# Patient Record
Sex: Male | Born: 1950 | Race: White | Hispanic: No | Marital: Married | Smoking: Never smoker
Health system: Southern US, Community
[De-identification: ages and names within clinical notes are randomized; demographics above are authoritative.]

## PROBLEM LIST (undated history)

## (undated) DIAGNOSIS — N200 Calculus of kidney: Secondary | ICD-10-CM

## (undated) HISTORY — PX: EYE SURGERY: SHX253

## (undated) HISTORY — DX: Calculus of kidney: N20.0

---

## 2006-12-15 ENCOUNTER — Ambulatory Visit: Payer: Self-pay | Admitting: Internal Medicine

## 2008-09-06 ENCOUNTER — Emergency Department: Payer: Self-pay | Admitting: Emergency Medicine

## 2012-01-14 DIAGNOSIS — M1711 Unilateral primary osteoarthritis, right knee: Secondary | ICD-10-CM | POA: Insufficient documentation

## 2016-11-01 DIAGNOSIS — M1711 Unilateral primary osteoarthritis, right knee: Secondary | ICD-10-CM | POA: Insufficient documentation

## 2016-11-01 DIAGNOSIS — E669 Obesity, unspecified: Secondary | ICD-10-CM | POA: Insufficient documentation

## 2018-12-31 ENCOUNTER — Emergency Department: Payer: Medicare Other

## 2018-12-31 ENCOUNTER — Other Ambulatory Visit: Payer: Self-pay

## 2018-12-31 ENCOUNTER — Observation Stay
Admission: EM | Admit: 2018-12-31 | Discharge: 2019-01-01 | Disposition: A | Discharge status: Home / Self Care | Payer: Medicare Other | Attending: Internal Medicine | Admitting: Internal Medicine

## 2018-12-31 ENCOUNTER — Encounter: Payer: Self-pay | Admitting: Emergency Medicine

## 2018-12-31 ENCOUNTER — Other Ambulatory Visit: Payer: Self-pay | Admitting: Urology

## 2018-12-31 DIAGNOSIS — N179 Acute kidney failure, unspecified: Secondary | ICD-10-CM | POA: Diagnosis present

## 2018-12-31 DIAGNOSIS — E785 Hyperlipidemia, unspecified: Secondary | ICD-10-CM | POA: Diagnosis not present

## 2018-12-31 DIAGNOSIS — N200 Calculus of kidney: Secondary | ICD-10-CM

## 2018-12-31 DIAGNOSIS — E7800 Pure hypercholesterolemia, unspecified: Secondary | ICD-10-CM | POA: Diagnosis not present

## 2018-12-31 DIAGNOSIS — R008 Other abnormalities of heart beat: Secondary | ICD-10-CM | POA: Insufficient documentation

## 2018-12-31 DIAGNOSIS — I700 Atherosclerosis of aorta: Secondary | ICD-10-CM | POA: Diagnosis not present

## 2018-12-31 DIAGNOSIS — N132 Hydronephrosis with renal and ureteral calculous obstruction: Secondary | ICD-10-CM | POA: Diagnosis not present

## 2018-12-31 DIAGNOSIS — K5730 Diverticulosis of large intestine without perforation or abscess without bleeding: Secondary | ICD-10-CM | POA: Diagnosis not present

## 2018-12-31 DIAGNOSIS — N23 Unspecified renal colic: Secondary | ICD-10-CM | POA: Diagnosis present

## 2018-12-31 DIAGNOSIS — Z6833 Body mass index (BMI) 33.0-33.9, adult: Secondary | ICD-10-CM | POA: Insufficient documentation

## 2018-12-31 DIAGNOSIS — Z86010 Personal history of colonic polyps: Secondary | ICD-10-CM | POA: Insufficient documentation

## 2018-12-31 DIAGNOSIS — R9431 Abnormal electrocardiogram [ECG] [EKG]: Secondary | ICD-10-CM | POA: Diagnosis not present

## 2018-12-31 DIAGNOSIS — K219 Gastro-esophageal reflux disease without esophagitis: Secondary | ICD-10-CM | POA: Diagnosis not present

## 2018-12-31 DIAGNOSIS — Z8249 Family history of ischemic heart disease and other diseases of the circulatory system: Secondary | ICD-10-CM | POA: Diagnosis not present

## 2018-12-31 DIAGNOSIS — M1990 Unspecified osteoarthritis, unspecified site: Secondary | ICD-10-CM | POA: Insufficient documentation

## 2018-12-31 DIAGNOSIS — Z79899 Other long term (current) drug therapy: Secondary | ICD-10-CM | POA: Insufficient documentation

## 2018-12-31 DIAGNOSIS — N400 Enlarged prostate without lower urinary tract symptoms: Secondary | ICD-10-CM | POA: Diagnosis not present

## 2018-12-31 DIAGNOSIS — N139 Obstructive and reflux uropathy, unspecified: Secondary | ICD-10-CM | POA: Diagnosis not present

## 2018-12-31 DIAGNOSIS — E876 Hypokalemia: Secondary | ICD-10-CM | POA: Diagnosis not present

## 2018-12-31 DIAGNOSIS — Z20828 Contact with and (suspected) exposure to other viral communicable diseases: Secondary | ICD-10-CM | POA: Insufficient documentation

## 2018-12-31 DIAGNOSIS — E669 Obesity, unspecified: Secondary | ICD-10-CM | POA: Diagnosis not present

## 2018-12-31 LAB — COMPREHENSIVE METABOLIC PANEL
ALT: 25 U/L (ref 0–44)
AST: 27 U/L (ref 15–41)
Albumin: 4.3 g/dL (ref 3.5–5.0)
Alkaline Phosphatase: 74 U/L (ref 38–126)
Anion gap: 16 — ABNORMAL HIGH (ref 5–15)
BUN: 20 mg/dL (ref 8–23)
CO2: 23 mmol/L (ref 22–32)
Calcium: 9.4 mg/dL (ref 8.9–10.3)
Chloride: 105 mmol/L (ref 98–111)
Creatinine, Ser: 1.5 mg/dL — ABNORMAL HIGH (ref 0.61–1.24)
GFR calc Af Amer: 55 mL/min — ABNORMAL LOW (ref >60)
GFR calc non Af Amer: 48 mL/min — ABNORMAL LOW (ref >60)
Glucose, Bld: 185 mg/dL — ABNORMAL HIGH (ref 70–99)
Potassium: 3.4 mmol/L — ABNORMAL LOW (ref 3.5–5.1)
Sodium: 144 mmol/L (ref 135–145)
Total Bilirubin: 0.7 mg/dL (ref 0.3–1.2)
Total Protein: 7.8 g/dL (ref 6.5–8.1)

## 2018-12-31 LAB — CBC WITH DIFFERENTIAL/PLATELET
Abs Immature Granulocytes: 0.07 10*3/uL (ref 0.00–0.07)
Basophils Absolute: 0.1 10*3/uL (ref 0.0–0.1)
Basophils Relative: 0 %
Eosinophils Absolute: 0 10*3/uL (ref 0.0–0.5)
Eosinophils Relative: 0 %
HCT: 43.1 % (ref 39.0–52.0)
Hemoglobin: 14.3 g/dL (ref 13.0–17.0)
Immature Granulocytes: 1 %
Lymphocytes Relative: 20 %
Lymphs Abs: 2.3 10*3/uL (ref 0.7–4.0)
MCH: 29.9 pg (ref 26.0–34.0)
MCHC: 33.2 g/dL (ref 30.0–36.0)
MCV: 90.2 fL (ref 80.0–100.0)
Monocytes Absolute: 0.9 10*3/uL (ref 0.1–1.0)
Monocytes Relative: 7 %
Neutro Abs: 8.3 10*3/uL — ABNORMAL HIGH (ref 1.7–7.7)
Neutrophils Relative %: 72 %
Platelets: 222 10*3/uL (ref 150–400)
RBC: 4.78 MIL/uL (ref 4.22–5.81)
RDW: 12.7 % (ref 11.5–15.5)
WBC: 11.7 10*3/uL — ABNORMAL HIGH (ref 4.0–10.5)
nRBC: 0 % (ref 0.0–0.2)

## 2018-12-31 LAB — URINALYSIS, COMPLETE (UACMP) WITH MICROSCOPIC
Bacteria, UA: NONE SEEN
Bilirubin Urine: NEGATIVE
Glucose, UA: 50 mg/dL — AB
Hgb urine dipstick: NEGATIVE
Ketones, ur: 20 mg/dL — AB
Leukocytes,Ua: NEGATIVE
Nitrite: NEGATIVE
Protein, ur: NEGATIVE mg/dL
Specific Gravity, Urine: 1.017 (ref 1.005–1.030)
pH: 7 (ref 5.0–8.0)

## 2018-12-31 LAB — RESPIRATORY PANEL BY RT PCR (FLU A&B, COVID)
Influenza A by PCR: NEGATIVE
Influenza B by PCR: NEGATIVE
SARS Coronavirus 2 by RT PCR: NEGATIVE

## 2018-12-31 LAB — LIPASE, BLOOD: Lipase: 28 U/L (ref 11–51)

## 2018-12-31 LAB — HIV ANTIBODY (ROUTINE TESTING W REFLEX): HIV Screen 4th Generation wRfx: NONREACTIVE

## 2018-12-31 MED ORDER — ONDANSETRON HCL 4 MG/2ML IJ SOLN
INTRAMUSCULAR | Status: AC
  Administered 2018-12-31: 4 mg via INTRAVENOUS
  Filled 2018-12-31: qty 2

## 2018-12-31 MED ORDER — SODIUM CHLORIDE 0.9 % IV BOLUS
1000.0000 mL | Freq: Once | INTRAVENOUS | Status: AC
  Administered 2018-12-31: 1000 mL via INTRAVENOUS

## 2018-12-31 MED ORDER — ONDANSETRON HCL 4 MG/2ML IJ SOLN
INTRAMUSCULAR | Status: AC
  Filled 2018-12-31: qty 2

## 2018-12-31 MED ORDER — ONDANSETRON HCL 4 MG/2ML IJ SOLN
4.0000 mg | Freq: Four times a day (QID) | INTRAMUSCULAR | Status: DC | PRN
  Administered 2018-12-31 – 2019-01-01 (×3): 4 mg via INTRAVENOUS
  Filled 2018-12-31 (×3): qty 2

## 2018-12-31 MED ORDER — KETOROLAC TROMETHAMINE 30 MG/ML IJ SOLN: 15.0000 mg | Freq: Once | INTRAMUSCULAR | Status: AC

## 2018-12-31 MED ORDER — ATORVASTATIN CALCIUM 10 MG PO TABS
10.0000 mg | ORAL_TABLET | Freq: Every day | ORAL | Status: DC
  Administered 2018-12-31: 10 mg via ORAL
  Filled 2018-12-31 (×2): qty 1

## 2018-12-31 MED ORDER — ACETAMINOPHEN 500 MG PO TABS
1000.0000 mg | ORAL_TABLET | Freq: Once | ORAL | Status: AC
  Administered 2018-12-31: 1000 mg via ORAL
  Filled 2018-12-31: qty 2

## 2018-12-31 MED ORDER — ONDANSETRON HCL 4 MG/2ML IJ SOLN
4.0000 mg | Freq: Once | INTRAMUSCULAR | Status: AC
  Administered 2018-12-31: 4 mg via INTRAVENOUS

## 2018-12-31 MED ORDER — KETOROLAC TROMETHAMINE 30 MG/ML IJ SOLN
INTRAMUSCULAR | Status: AC
  Filled 2018-12-31: qty 1

## 2018-12-31 MED ORDER — KETOROLAC TROMETHAMINE 15 MG/ML IJ SOLN
15.0000 mg | Freq: Four times a day (QID) | INTRAMUSCULAR | Status: DC | PRN
  Administered 2018-12-31 – 2019-01-01 (×3): 15 mg via INTRAVENOUS
  Filled 2018-12-31 (×4): qty 1

## 2018-12-31 MED ORDER — MORPHINE SULFATE (PF) 4 MG/ML IV SOLN
4.0000 mg | INTRAVENOUS | Status: DC | PRN
  Administered 2018-12-31 (×2): 4 mg via INTRAVENOUS
  Filled 2018-12-31 (×3): qty 1

## 2018-12-31 MED ORDER — FENTANYL CITRATE (PF) 100 MCG/2ML IJ SOLN
50.0000 ug | Freq: Once | INTRAMUSCULAR | Status: AC
  Administered 2018-12-31: 50 ug via INTRAVENOUS
  Filled 2018-12-31: qty 2

## 2018-12-31 MED ORDER — OXYCODONE HCL 5 MG PO TABS
5.0000 mg | ORAL_TABLET | Freq: Once | ORAL | Status: AC
  Administered 2018-12-31: 5 mg via ORAL
  Filled 2018-12-31: qty 1

## 2018-12-31 MED ORDER — ONDANSETRON HCL 4 MG/2ML IJ SOLN: 4.0000 mg | Freq: Once | INTRAMUSCULAR | Status: AC

## 2018-12-31 MED ORDER — ONDANSETRON HCL 4 MG PO TABS
4.0000 mg | ORAL_TABLET | Freq: Four times a day (QID) | ORAL | Status: DC | PRN
  Filled 2018-12-31: qty 1

## 2018-12-31 MED ORDER — TAMSULOSIN HCL 0.4 MG PO CAPS
0.4000 mg | ORAL_CAPSULE | Freq: Once | ORAL | Status: AC
  Administered 2018-12-31: 0.4 mg via ORAL
  Filled 2018-12-31: qty 1

## 2018-12-31 MED ORDER — CEFAZOLIN SODIUM-DEXTROSE 1-4 GM/50ML-% IV SOLN
1.0000 g | INTRAVENOUS | Status: AC
  Administered 2019-01-01: 1 g via INTRAVENOUS
  Filled 2018-12-31: qty 50

## 2018-12-31 MED ORDER — MORPHINE SULFATE (PF) 4 MG/ML IV SOLN
INTRAVENOUS | Status: AC
  Filled 2018-12-31: qty 1

## 2018-12-31 MED ORDER — POTASSIUM CHLORIDE CRYS ER 20 MEQ PO TBCR: 40.0000 meq | EXTENDED_RELEASE_TABLET | Freq: Once | ORAL | Status: DC

## 2018-12-31 MED ORDER — MORPHINE SULFATE (PF) 4 MG/ML IV SOLN
4.0000 mg | Freq: Once | INTRAVENOUS | Status: AC
  Administered 2018-12-31: 4 mg via INTRAVENOUS

## 2018-12-31 MED ORDER — SODIUM CHLORIDE 0.9 % IV SOLN
INTRAVENOUS | Status: DC
  Administered 2018-12-31 (×2): via INTRAVENOUS

## 2018-12-31 MED ORDER — MORPHINE SULFATE (PF) 4 MG/ML IV SOLN
4.0000 mg | INTRAVENOUS | Status: DC | PRN
  Administered 2018-12-31: 4 mg via INTRAVENOUS

## 2018-12-31 MED ORDER — KETOROLAC TROMETHAMINE 30 MG/ML IJ SOLN
INTRAMUSCULAR | Status: AC
  Administered 2018-12-31: 30 mg via INTRAVENOUS
  Filled 2018-12-31: qty 1

## 2018-12-31 MED ORDER — METOCLOPRAMIDE HCL 5 MG/ML IJ SOLN
10.0000 mg | Freq: Once | INTRAMUSCULAR | Status: AC
  Administered 2018-12-31: 10 mg via INTRAVENOUS
  Filled 2018-12-31: qty 2

## 2018-12-31 NOTE — ED Notes (Signed)
Patient transported to CT 

## 2018-12-31 NOTE — Consult Note (Signed)
Urology Consult  Chief Complaint: Kidney stone  History of Present Illness: Trevor Armstrong is a 68 y.o. male seen in consultation at the request of Dr. Cruzita Lederer for a left proximal ureteral calculus.  Last night he had onset of nausea and vomiting and subsequently developed left lower quadrant abdominal pain.  He was seen in the ED where his pain became localized to the left flank.  No identifiable precipitating, aggravating or alleviating factors.  Pain was rated severe.  Denied fever, chills or bothersome lower urinary tract symptoms.    CT the abdomen pelvis was performed which was remarkable for a 7 mm left proximal ureteral calculus.  He also had a nonobstructing 5 mm right renal calculus.  He received multiple doses of IV analgesics including Toradol and is admitted to the hospitalist for pain control.  No prior history of urologic problems or stone disease.  SARS-CoV-2 was negative.  History reviewed. No pertinent past medical history.  History reviewed. No pertinent surgical history.  Home Medications:  Current Meds  Medication Sig  . atorvastatin (LIPITOR) 10 MG tablet Take 10 mg by mouth daily.  . NON FORMULARY Take 4.5 mg by mouth daily.  . valACYclovir (VALTREX) 1000 MG tablet Take 1,000 mg by mouth 2 (two) times daily.     Allergies:  Allergies  Allergen Reactions  . Sulfa Antibiotics     No family history on file.  Social History:  reports that he has never smoked. He has never used smokeless tobacco. No history on file for alcohol and drug.  ROS: A complete review of systems was performed.  All systems are negative except for pertinent findings as noted.  Physical Exam:  Vital signs in last 24 hours: Temp:  [97.7 F (36.5 C)] 97.7 F (36.5 C) (12/09 0312) Pulse Rate:  [49-92] 88 (12/09 0700) Resp:  [15-20] 15 (12/09 0700) BP: (133-155)/(63-91) 133/63 (12/09 0700) SpO2:  [91 %-97 %] 91 % (12/09 0700) Weight:  [104.3 kg] 104.3 kg (12/09 0314)  Constitutional:  Alert and oriented, No acute distress HEENT: [place] AT, moist mucus membranes.  Trachea midline, no masses Cardiovascular: Regular rate and rhythm, no clubbing, cyanosis, or edema. Respiratory: Normal respiratory effort, lungs clear bilaterally GI: Abdomen is soft, nontender, nondistended, no abdominal masses GU: No CVA tenderness Skin: No rashes, bruises or suspicious lesions Lymph: No cervical or inguinal adenopathy Neurologic: Grossly intact, no focal deficits, moving all 4 extremities Psychiatric: Normal mood and affect   Laboratory Data:  Recent Labs    12/31/18 0327  WBC 11.7*  HGB 14.3  HCT 43.1   Recent Labs    12/31/18 0327  NA 144  K 3.4*  CL 105  CO2 23  GLUCOSE 185*  BUN 20  CREATININE 1.50*  CALCIUM 9.4   No results for input(s): LABPT, INR in the last 72 hours. No results for input(s): LABURIN in the last 72 hours. Results for orders placed or performed during the hospital encounter of 12/31/18  Respiratory Panel by RT PCR (Flu A&B, Covid) - Nasopharyngeal Swab     Status: None   Collection Time: 12/31/18  6:07 AM   Specimen: Nasopharyngeal Swab  Result Value Ref Range Status   SARS Coronavirus 2 by RT PCR NEGATIVE NEGATIVE Final    Comment: (NOTE) SARS-CoV-2 target nucleic acids are NOT DETECTED. The SARS-CoV-2 RNA is generally detectable in upper respiratoy specimens during the acute phase of infection. The lowest concentration of SARS-CoV-2 viral copies this assay can detect is  131 copies/mL. A negative result does not preclude SARS-Cov-2 infection and should not be used as the sole basis for treatment or other patient management decisions. A negative result may occur with  improper specimen collection/handling, submission of specimen other than nasopharyngeal swab, presence of viral mutation(s) within the areas targeted by this assay, and inadequate number of viral copies (<131 copies/mL). A negative result must be combined with  clinical observations, patient history, and epidemiological information. The expected result is Negative. Fact Sheet for Patients:  https://www.moore.com/ Fact Sheet for Healthcare Providers:  https://www.young.biz/ This test is not yet ap proved or cleared by the [place] FDA and  has been authorized for detection and/or diagnosis of SARS-CoV-2 by FDA under an Emergency Use Authorization (EUA). This EUA will remain  in effect (meaning this test can be used) for the duration of the COVID-19 declaration under Section 564(b)(1) of the Act, 21 U.S.C. section 360bbb-3(b)(1), unless the authorization is terminated or revoked sooner.    Influenza A by PCR NEGATIVE NEGATIVE Final   Influenza B by PCR NEGATIVE NEGATIVE Final    Comment: (NOTE) The Xpert Xpress SARS-CoV-2/FLU/RSV assay is intended as an aid in  the diagnosis of influenza from Nasopharyngeal swab specimens and  should not be used as a sole basis for treatment. Nasal washings and  aspirates are unacceptable for Xpert Xpress SARS-CoV-2/FLU/RSV  testing. Fact Sheet for Patients: https://www.moore.com/ Fact Sheet for Healthcare Providers: https://www.young.biz/ This test is not yet approved or cleared by the [place] FDA and  has been authorized for detection and/or diagnosis of SARS-CoV-2 by  FDA under an Emergency Use Authorization (EUA). This EUA will remain  in effect (meaning this test can be used) for the duration of the  Covid-19 declaration under Section 564(b)(1) of the Act, 21  U.S.C. section 360bbb-3(b)(1), unless the authorization is  terminated or revoked. Performed at [facility], [address], [place], [place] [address]      Radiologic Imaging: Images were personally reviewed  Ct Renal Stone Study  Result Date: 12/31/2018 CLINICAL DATA:  Flank pain with stone disease suspected, left-sided. EXAM: CT ABDOMEN  AND PELVIS WITHOUT CONTRAST TECHNIQUE: Multidetector CT imaging of the abdomen and pelvis was performed following the standard protocol without IV contrast. COMPARISON:  None. FINDINGS: Lower chest:  No contributory findings. Hepatobiliary: No focal liver abnormality.No evidence of biliary obstruction or stone. Pancreas: Unremarkable. Spleen: Unremarkable. Adrenals/Urinary Tract: Negative adrenals. 5 mm stone at the lower pole right kidney. 7 x 6 mm proximal left ureteral stone with hydronephrosis, renal expansion, and perinephric edema. Cystic density from the lower pole right kidney. Unremarkable bladder. Stomach/Bowel: No obstruction. No appendicitis. Sigmoid diverticulosis. Vascular/Lymphatic: No acute vascular abnormality. Moderate atherosclerotic calcification of the aorta. No mass or adenopathy. Reproductive:Mild enlargement of the prostate up lifting the bladder base. Other: No ascites or pneumoperitoneum. Musculoskeletal: Spondylosis and facet arthropathy. Levoscoliosis of the thoracolumbar spine. IMPRESSION: 1. Obstructing 7 x 6 mm left proximal ureteral stone. 2. 5 mm right renal calculus. 3.  Aortic Atherosclerosis (ICD10-I70.0). 4. Sigmoid diverticulosis. Electronically Signed   By: Marnee Spring M.D.   On: 12/31/2018 04:06    Impression/Assessment:  68 y.o. male with left renal colic secondary to a 7 mm left proximal ureteral calculus.  He is presently pain-free.  His calculus is superior to the left lower pole and he is not a candidate for shockwave lithotripsy tomorrow since he received ketorolac.  He was informed based on stone size it is unlikely he will be  able to pass spontaneously.  Recommendation:  -Based on urgency, clinic schedule and OR availability will add on for left ureteroscopy/laser lithotripsy with stone removal tomorrow and lieu of urgent stenting today.  -Other options were reviewed including a trial of medical expulsion therapy and shockwave lithotripsy performed next  week. He prefers to have his stone treated as soon as possible.   12/31/2018, 8:04 AM  Irineo AxonScott Stoioff,  MD

## 2018-12-31 NOTE — ED Provider Notes (Signed)
[facility] Emergency Department Provider Note  ____________________________________________  Time seen: Approximately 3:35 AM  I have reviewed the triage vital signs and the nursing notes.   HISTORY  Chief Complaint Abdominal Pain   HPI Chrystopher Stangl is a 68 y.o. male with a history of arthritis, GERD, hyperlipidemia, diverticulitis who presents for evaluation of abdominal and back pain.  Pain started at midnight while patient was watching TV.  He describes the pain as dull with intermittent sharp component.  Initially on the left side of his abdomen, now it is on the left flank area.  He has had nausea and several episodes of nonbloody nonbilious emesis.  The pain is severe.  No fever or chills, no dysuria or hematuria, no constipation or diarrhea, no chest pain or shortness of breath, no paresthesias of his extremities.  Patient denies any history of kidney stones or any prior abdominal surgeries.   PMH Obesity (BMI 35.0-39.9 without comorbidity), unspecified 11/01/2016  Primary osteoarthritis of left knee 11/01/2016  Primary osteoarthritis of right knee 11/01/2016  Articular cartilage disorder, other specified site 01/15/2012  Knee pain 01/14/2012  Arthritis of knee, right 01/14/2012  GERD (gastroesophageal reflux disease)   Overview:   symptomatically improved on omeprazole   Elevated triglycerides with high cholesterol   Psoriasis   History of colonic polyps   Overview:   Family history of colon cancer in father (87's) and brother (33's).  Last screening 2012 showed tubular adenoma x 3.    Diverticulitis   Overview:   acute episode 2009   Anxiety and depression   Overview:   symptomatically improved on antidepressant medication.   Hyperlipidemia      Allergies Sulfa antibiotics  FH Thyroid disease Brother    Cancer Brother Bill prostate  Cancer Brother Mark colan  Diabetes Brother Loraine Leriche   Stroke Brother Loraine Leriche    Cancer Father Molly Maduro colan  Colon cancer Father Molly Maduro   Coronary Artery Disease (Blocked arteries around heart) Father Molly Maduro   Thyroid disease Father Molly Maduro   Diabetes Mother Britta Mccreedy   Diabetes type II Mother Britta Mccreedy   Stroke Mother Britta Mccreedy      Social History Social History   Tobacco Use  . Smoking status: Never Smoker  . Smokeless tobacco: Never Used  Substance Use Topics  . Alcohol use: Not on file  . Drug use: Not on file    Review of Systems  Constitutional: Negative for fever. Eyes: Negative for visual changes. ENT: Negative for sore throat. Neck: No neck pain  Cardiovascular: Negative for chest pain. Respiratory: Negative for shortness of breath. Gastrointestinal: + L sided abdominal pain, nausea, and vomiting. No diarrhea. Genitourinary: Negative for dysuria. Musculoskeletal: Negative for back pain. + L flank pain Skin: Negative for rash. Neurological: Negative for headaches, weakness or numbness. Psych: No SI or HI  ____________________________________________   PHYSICAL EXAM:  VITAL SIGNS: ED Triage Vitals  Enc Vitals Group     BP 12/31/18 0312 (!) 155/76     Pulse Rate 12/31/18 0312 63     Resp 12/31/18 0312 20     Temp 12/31/18 0312 97.7 F (36.5 C)     Temp Source 12/31/18 0312 Oral     SpO2 12/31/18 0312 96 %     Weight 12/31/18 0314 230 lb (104.3 kg)     Height 12/31/18 0314 5\' 9"  (1.753 m)     Head Circumference --      Peak Flow --      Pain Score 12/31/18  [id number] 10     Pain Loc --      Pain Edu? --      Excl. in GC? --     Constitutional: Alert and oriented. Well appearing and in no apparent distress. HEENT:      Head: Normocephalic and atraumatic.         Eyes: Conjunctivae are normal. Sclera is non-icteric.       Mouth/Throat: Mucous membranes are moist.       Neck: Supple with no signs of meningismus. Cardiovascular: Regular rate and rhythm. No murmurs, gallops, or rubs. 2+ symmetrical distal pulses are present in all  extremities. No JVD. Respiratory: Normal respiratory effort. Lungs are clear to auscultation bilaterally. No wheezes, crackles, or rhonchi.  Gastrointestinal: Soft, non tender, and non distended with positive bowel sounds. No rebound or guarding. Genitourinary: No CVA tenderness. Musculoskeletal: Nontender with normal range of motion in all extremities. No edema, cyanosis, or erythema of extremities. Neurologic: Normal speech and language. Face is symmetric. Moving all extremities. No gross focal neurologic deficits are appreciated. Skin: Skin is warm, dry and intact. No rash noted. Psychiatric: Mood and affect are normal. Speech and behavior are normal.  ____________________________________________   LABS (all labs ordered are listed, but only abnormal results are displayed)  Labs Reviewed  CBC WITH DIFFERENTIAL/PLATELET - Abnormal; Notable for the following components:      Result Value   WBC 11.7 (*)    Neutro Abs 8.3 (*)    All other components within normal limits  COMPREHENSIVE METABOLIC PANEL - Abnormal; Notable for the following components:   Potassium 3.4 (*)    Glucose, Bld 185 (*)    Creatinine, Ser 1.50 (*)    GFR calc non Af Amer 48 (*)    GFR calc Af Amer 55 (*)    Anion gap 16 (*)    All other components within normal limits  URINALYSIS, COMPLETE (UACMP) WITH MICROSCOPIC - Abnormal; Notable for the following components:   Color, Urine YELLOW (*)    APPearance HAZY (*)    Glucose, UA 50 (*)    Ketones, ur 20 (*)    All other components within normal limits  RESPIRATORY PANEL BY RT PCR (FLU A&B, COVID)  LIPASE, BLOOD   ____________________________________________  EKG  ED ECG REPORT I, Nita Sicklearolina Trevor Wilkie, the attending physician, personally viewed and interpreted this ECG.  Normal sinus rhythm, rate of 94, prolonged QTC, several PVCs, normal axis, no ST elevations or depressions.  No prior for comparison. ____________________________________________   RADIOLOGY  I have personally reviewed the images performed during this visit and I agree with the Radiologist's read.   Interpretation by Radiologist:  Ct Renal Stone Study  Result Date: 12/31/2018 CLINICAL DATA:  Flank pain with stone disease suspected, left-sided. EXAM: CT ABDOMEN AND PELVIS WITHOUT CONTRAST TECHNIQUE: Multidetector CT imaging of the abdomen and pelvis was performed following the standard protocol without IV contrast. COMPARISON:  None. FINDINGS: Lower chest:  No contributory findings. Hepatobiliary: No focal liver abnormality.No evidence of biliary obstruction or stone. Pancreas: Unremarkable. Spleen: Unremarkable. Adrenals/Urinary Tract: Negative adrenals. 5 mm stone at the lower pole right kidney. 7 x 6 mm proximal left ureteral stone with hydronephrosis, renal expansion, and perinephric edema. Cystic density from the lower pole right kidney. Unremarkable bladder. Stomach/Bowel: No obstruction. No appendicitis. Sigmoid diverticulosis. Vascular/Lymphatic: No acute vascular abnormality. Moderate atherosclerotic calcification of the aorta. No mass or adenopathy. Reproductive:Mild enlargement of the prostate up lifting the bladder base. Other: No ascites or  pneumoperitoneum. Musculoskeletal: Spondylosis and facet arthropathy. Levoscoliosis of the thoracolumbar spine. IMPRESSION: 1. Obstructing 7 x 6 mm left proximal ureteral stone. 2. 5 mm right renal calculus. 3.  Aortic Atherosclerosis (ICD10-I70.0). 4. Sigmoid diverticulosis. Electronically Signed   By: Monte Fantasia M.D.   On: 12/31/2018 04:06      ____________________________________________   PROCEDURES  Procedure(s) performed: None Procedures Critical Care performed:  None ____________________________________________   INITIAL IMPRESSION / ASSESSMENT AND PLAN / ED COURSE   68 y.o. male with a history of arthritis, GERD, hyperlipidemia, diverticulitis who presents for evaluation of sudden onset of L sided abdominal  and L flank pain associated with nausea and vomiting.  Ddx kidney stone, pyelonephritis, diverticulitis, dissection, AAA, PUD, SBO.  Patient looks uncomfortable, vitals are within normal limits, abdomen is obese, soft with no significant tenderness on palpation, no CVA tenderness, no palpable pulsatile mass, pulses are strong in all 4 extremities with normal brisk capillary refill and normal neuro exam.  EKG showed frequent PVCs but no other signs of dysrhythmias or ischemia.  We will give morphine and Zofran for pain.  Will check labs, CT renal, urinalysis Clinical Course as of Dec 31 603  Wed Dec 31, 2018  0535 CT showing a 7x6 mm proximal ureteral stone. No signs of overlying infection. Creatinine is at baseline. Mild AG in the setting of several episodes of vomiting. Patient given IVF, toradol, morphine, fentanyl, percocet and several rounds of anti-emetic. Continues to have persistent nausea and pain. Will discuss with Urology for admission.    [CV]    Clinical Course User Index [CV] Alfred Levins [place], MD   _________________________ 6:04 AM on 12/31/2018 -----------------------------------------  Discussed with Dr. Bernardo Heater who recommended admission to hospitalist for symptom control and consult to Urology for possible stenting placement pending availability of OR today. Will discuss with the Hospitalist for admission.    As part of my medical decision making, I reviewed the following data within the [facility] notes reviewed and incorporated, Labs reviewed , Old chart reviewed, Radiograph reviewed , Discussed with admitting physician , A consult was requested and obtained from this/these consultant(s) Urology, Notes from prior ED visits and [place] Controlled Substance Database   Please note:  Patient was evaluated in Emergency Department today for the symptoms described in the history of present illness. Patient was evaluated in the context of the global  COVID-19 pandemic, which necessitated consideration that the patient might be at risk for infection with the SARS-CoV-2 virus that causes COVID-19. Institutional protocols and algorithms that pertain to the evaluation of patients at risk for COVID-19 are in a state of rapid change based on information released by regulatory bodies including the CDC and federal and state organizations. These policies and algorithms were followed during the patient's care in the ED.  Some ED evaluations and interventions may be delayed as a result of limited staffing during the pandemic.   ____________________________________________   FINAL CLINICAL IMPRESSION(S) / ED DIAGNOSES   Final diagnoses:  Renal colic  Kidney stone      NEW MEDICATIONS STARTED DURING THIS VISIT:  ED Discharge Orders    None       Note:  This document was prepared using Dragon voice recognition software and may include unintentional dictation errors.    Rudene Re, MD 12/31/18 [phone]

## 2018-12-31 NOTE — Progress Notes (Signed)
Continues with intermittent pain and nausea.  Scheduled for left ureteroscopy with laser lithotripsy/stone removal tomorrow.  The indications and nature of the planned procedure were discussed as well as the potential  benefits and expected outcome.  Alternatives have been discussed in detail. The most common complications and side effects were discussed including but not limited to infection/sepsis; blood loss; damage to urethra, bladder, ureter; need for multiple surgeries; need for prolonged stent placement as well as general anesthesia risks. Although uncommon he was also informed of the possibility that the calculus may not be able to be treated due to inability to obtain access to the upper ureter. In that event he would require stent placement and a follow-up procedure after a period of stent dilation. All of his questions were answered and he desires to proceed.

## 2018-12-31 NOTE — ED Triage Notes (Signed)
Pt to triage via w/c, mask in place; pt reports left lower abd pain radiating around into back accomp by N/V since midnight; denies hx of same

## 2018-12-31 NOTE — ED Notes (Signed)
Urology to bedside

## 2018-12-31 NOTE — ED Notes (Signed)
Pt reports abdominal pain radiating to the left back.  He is pale and states he is in pain 10/10.  He reports N/V but no emesis episodes upon arrival to ED.  Bigeminy shown on EKG monitor.  No known history reported by patient.  MD aware.

## 2018-12-31 NOTE — H&P (View-Only) (Signed)
Urology Consult  Chief Complaint: Kidney stone  History of Present Illness: Trevor Armstrong is a 68 y.o. male seen in consultation at the request of Dr. Cruzita Lederer for a left proximal ureteral calculus.  Last night he had onset of nausea and vomiting and subsequently developed left lower quadrant abdominal pain.  He was seen in the ED where his pain became localized to the left flank.  No identifiable precipitating, aggravating or alleviating factors.  Pain was rated severe.  Denied fever, chills or bothersome lower urinary tract symptoms.    CT the abdomen pelvis was performed which was remarkable for a 7 mm left proximal ureteral calculus.  He also had a nonobstructing 5 mm right renal calculus.  He received multiple doses of IV analgesics including Toradol and is admitted to the hospitalist for pain control.  No prior history of urologic problems or stone disease.  SARS-CoV-2 was negative.  History reviewed. No pertinent past medical history.  History reviewed. No pertinent surgical history.  Home Medications:  Current Meds  Medication Sig  . atorvastatin (LIPITOR) 10 MG tablet Take 10 mg by mouth daily.  . NON FORMULARY Take 4.5 mg by mouth daily.  . valACYclovir (VALTREX) 1000 MG tablet Take 1,000 mg by mouth 2 (two) times daily.     Allergies:  Allergies  Allergen Reactions  . Sulfa Antibiotics     No family history on file.  Social History:  reports that he has never smoked. He has never used smokeless tobacco. No history on file for alcohol and drug.  ROS: A complete review of systems was performed.  All systems are negative except for pertinent findings as noted.  Physical Exam:  Vital signs in last 24 hours: Temp:  [97.7 F (36.5 C)] 97.7 F (36.5 C) (12/09 0312) Pulse Rate:  [49-92] 88 (12/09 0700) Resp:  [15-20] 15 (12/09 0700) BP: (133-155)/(63-91) 133/63 (12/09 0700) SpO2:  [91 %-97 %] 91 % (12/09 0700) Weight:  [104.3 kg] 104.3 kg (12/09 0314)  Constitutional:  Alert and oriented, No acute distress HEENT: [place] AT, moist mucus membranes.  Trachea midline, no masses Cardiovascular: Regular rate and rhythm, no clubbing, cyanosis, or edema. Respiratory: Normal respiratory effort, lungs clear bilaterally GI: Abdomen is soft, nontender, nondistended, no abdominal masses GU: No CVA tenderness Skin: No rashes, bruises or suspicious lesions Lymph: No cervical or inguinal adenopathy Neurologic: Grossly intact, no focal deficits, moving all 4 extremities Psychiatric: Normal mood and affect   Laboratory Data:  Recent Labs    12/31/18 0327  WBC 11.7*  HGB 14.3  HCT 43.1   Recent Labs    12/31/18 0327  NA 144  K 3.4*  CL 105  CO2 23  GLUCOSE 185*  BUN 20  CREATININE 1.50*  CALCIUM 9.4   No results for input(s): LABPT, INR in the last 72 hours. No results for input(s): LABURIN in the last 72 hours. Results for orders placed or performed during the hospital encounter of 12/31/18  Respiratory Panel by RT PCR (Flu A&B, Covid) - Nasopharyngeal Swab     Status: None   Collection Time: 12/31/18  6:07 AM   Specimen: Nasopharyngeal Swab  Result Value Ref Range Status   SARS Coronavirus 2 by RT PCR NEGATIVE NEGATIVE Final    Comment: (NOTE) SARS-CoV-2 target nucleic acids are NOT DETECTED. The SARS-CoV-2 RNA is generally detectable in upper respiratoy specimens during the acute phase of infection. The lowest concentration of SARS-CoV-2 viral copies this assay can detect is  131 copies/mL. A negative result does not preclude SARS-Cov-2 infection and should not be used as the sole basis for treatment or other patient management decisions. A negative result may occur with  improper specimen collection/handling, submission of specimen other than nasopharyngeal swab, presence of viral mutation(s) within the areas targeted by this assay, and inadequate number of viral copies (<131 copies/mL). A negative result must be combined with  clinical observations, patient history, and epidemiological information. The expected result is Negative. Fact Sheet for Patients:  https://www.moore.com/ Fact Sheet for Healthcare Providers:  https://www.young.biz/ This test is not yet ap proved or cleared by the [place] FDA and  has been authorized for detection and/or diagnosis of SARS-CoV-2 by FDA under an Emergency Use Authorization (EUA). This EUA will remain  in effect (meaning this test can be used) for the duration of the COVID-19 declaration under Section 564(b)(1) of the Act, 21 U.S.C. section 360bbb-3(b)(1), unless the authorization is terminated or revoked sooner.    Influenza A by PCR NEGATIVE NEGATIVE Final   Influenza B by PCR NEGATIVE NEGATIVE Final    Comment: (NOTE) The Xpert Xpress SARS-CoV-2/FLU/RSV assay is intended as an aid in  the diagnosis of influenza from Nasopharyngeal swab specimens and  should not be used as a sole basis for treatment. Nasal washings and  aspirates are unacceptable for Xpert Xpress SARS-CoV-2/FLU/RSV  testing. Fact Sheet for Patients: https://www.moore.com/ Fact Sheet for Healthcare Providers: https://www.young.biz/ This test is not yet approved or cleared by the [place] FDA and  has been authorized for detection and/or diagnosis of SARS-CoV-2 by  FDA under an Emergency Use Authorization (EUA). This EUA will remain  in effect (meaning this test can be used) for the duration of the  Covid-19 declaration under Section 564(b)(1) of the Act, 21  U.S.C. section 360bbb-3(b)(1), unless the authorization is  terminated or revoked. Performed at [facility], [address], [place], [place] [address]      Radiologic Imaging: Images were personally reviewed  Ct Renal Stone Study  Result Date: 12/31/2018 CLINICAL DATA:  Flank pain with stone disease suspected, left-sided. EXAM: CT ABDOMEN  AND PELVIS WITHOUT CONTRAST TECHNIQUE: Multidetector CT imaging of the abdomen and pelvis was performed following the standard protocol without IV contrast. COMPARISON:  None. FINDINGS: Lower chest:  No contributory findings. Hepatobiliary: No focal liver abnormality.No evidence of biliary obstruction or stone. Pancreas: Unremarkable. Spleen: Unremarkable. Adrenals/Urinary Tract: Negative adrenals. 5 mm stone at the lower pole right kidney. 7 x 6 mm proximal left ureteral stone with hydronephrosis, renal expansion, and perinephric edema. Cystic density from the lower pole right kidney. Unremarkable bladder. Stomach/Bowel: No obstruction. No appendicitis. Sigmoid diverticulosis. Vascular/Lymphatic: No acute vascular abnormality. Moderate atherosclerotic calcification of the aorta. No mass or adenopathy. Reproductive:Mild enlargement of the prostate up lifting the bladder base. Other: No ascites or pneumoperitoneum. Musculoskeletal: Spondylosis and facet arthropathy. Levoscoliosis of the thoracolumbar spine. IMPRESSION: 1. Obstructing 7 x 6 mm left proximal ureteral stone. 2. 5 mm right renal calculus. 3.  Aortic Atherosclerosis (ICD10-I70.0). 4. Sigmoid diverticulosis. Electronically Signed   By: Marnee Spring M.D.   On: 12/31/2018 04:06    Impression/Assessment:  68 y.o. male with left renal colic secondary to a 7 mm left proximal ureteral calculus.  He is presently pain-free.  His calculus is superior to the left lower pole and he is not a candidate for shockwave lithotripsy tomorrow since he received ketorolac.  He was informed based on stone size it is unlikely he will be  able to pass spontaneously.  Recommendation:  -Based on urgency, clinic schedule and OR availability will add on for left ureteroscopy/laser lithotripsy with stone removal tomorrow and lieu of urgent stenting today.  -Other options were reviewed including a trial of medical expulsion therapy and shockwave lithotripsy performed next  week. He prefers to have his stone treated as soon as possible.   12/31/2018, 8:04 AM  Scott Stoioff,  MD       

## 2018-12-31 NOTE — ED Notes (Signed)
Hospitalist to bedside.

## 2018-12-31 NOTE — ED Notes (Signed)
Pt given lunch tray and states he does not want it- pt states all he wants is ice water- pt given ice water- pt states he still has pain and nausea

## 2018-12-31 NOTE — H&P (Signed)
History and Physical    Trevor Armstrong OJJ:[id number] DOB: 1950/04/14 DOA: 12/31/2018  I have briefly reviewed the patient's prior medical records in [facility]  PCP: Trevor Drum, MD  Patient coming from: home  Chief Complaint: left flank pain  HPI: Trevor Armstrong is a 68 y.o. male with medical history significant of hyperlipidemia on statin, prior diverticulitis was otherwise fairly healthy presents to the hospital with chief complaint of sudden onset of left flank pain associated with nausea and vomiting.  Initially the pain was in the left lower abdomen and started going into his back.  Because of nausea vomiting he initially thought he has food poisoning.  The pain was severe on admission.  He denies any fever or chills, denies any chest pain, denies any shortness of breath.  He denies any dysuria.  He denies any personal history of kidney stones.  Currently has not had any episodes of vomiting since arrival into the ED and his pain is well controlled with receiving pain medications.  ED Course: In the ED he is afebrile 97.7, satting well on room air.  Blood pressure is in the 150s.  Blood work shows elevated creatinine at 1.5, unknown baseline.  Potassium is 3.4.  His white count is 11.7.  Dr. Bernardo Armstrong with urology were consulted by EDP and we are asked to admit.  SARS-CoV-2 was negative.  Review of Systems: All systems reviewed, and apart from HPI, all negative  History reviewed. No pertinent past medical history.  History reviewed. No pertinent surgical history.   reports that he has never smoked. He has never used smokeless tobacco. No history on file for alcohol and drug.  Allergies  Allergen Reactions   Sulfa Antibiotics     Family history reviewed, negative  Prior to Admission medications   Medication Sig Start Date End Date Taking? Authorizing Provider  atorvastatin (LIPITOR) 10 MG tablet Take 10 mg by mouth daily. 12/14/18  Yes [provider]  NON  FORMULARY Take 4.5 mg by mouth daily.   Yes [provider]  valACYclovir (VALTREX) 1000 MG tablet Take 1,000 mg by mouth 2 (two) times daily.  12/26/18  Yes [provider]    Physical Exam: Vitals:   12/31/18 0530 12/31/18 0558 12/31/18 0630 12/31/18 0700  BP: (!) 153/91 (!) 153/91 (!) 145/82 133/63  Pulse: 92 89 (!) 49 88  Resp:  18 18 15   Temp:      TempSrc:      SpO2: 97% 95% 93% 91%  Weight:      Height:        Constitutional: NAD, calm, comfortable Eyes: PERRL, lids and conjunctivae normal ENMT: Mucous membranes are moist.  Neck: normal, supple Respiratory: clear to auscultation bilaterally, no wheezing, no crackles. Normal respiratory effort. No accessory muscle use.  Cardiovascular: Regular rate and rhythm, no murmurs / rubs / gallops. No extremity edema. 2+ pedal pulses.  Abdomen: no tenderness, no masses palpated. Bowel sounds positive.  Musculoskeletal: no clubbing / cyanosis. Normal muscle tone.  Skin: no rashes, lesions, ulcers. No induration Neurologic: CN 2-12 grossly intact. Strength 5/5 in all 4.  Psychiatric: Normal judgment and insight. Alert and oriented x 3. Normal mood.   Labs on Admission: I have personally reviewed following labs and imaging studies  CBC: Recent Labs  Lab 12/31/18 0327  WBC 11.7*  NEUTROABS 8.3*  HGB 14.3  HCT 43.1  MCV 90.2  PLT 937   Basic Metabolic Panel: Recent Labs  Lab 12/31/18 0327  NA 144  K 3.4*  CL 105  CO2 23  GLUCOSE 185*  BUN 20  CREATININE 1.50*  CALCIUM 9.4   Liver Function Tests: Recent Labs  Lab 12/31/18 0327  AST 27  ALT 25  ALKPHOS 74  BILITOT 0.7  PROT 7.8  ALBUMIN 4.3   Coagulation Profile: No results for input(s): INR, PROTIME in the last 168 hours. BNP (last 3 results) No results for input(s): PROBNP in the last 8760 hours. CBG: No results for input(s): GLUCAP in the last 168 hours. Thyroid Function Tests: No results for input(s): TSH, T4TOTAL, FREET4, T3FREE,  THYROIDAB in the last 72 hours. Urine analysis:    Component Value Date/Time   COLORURINE YELLOW (A) 12/31/2018 0431   APPEARANCEUR HAZY (A) 12/31/2018 0431   LABSPEC 1.017 12/31/2018 0431   PHURINE 7.0 12/31/2018 0431   GLUCOSEU 50 (A) 12/31/2018 0431   HGBUR NEGATIVE 12/31/2018 0431   BILIRUBINUR NEGATIVE 12/31/2018 0431   KETONESUR 20 (A) 12/31/2018 0431   PROTEINUR NEGATIVE 12/31/2018 0431   NITRITE NEGATIVE 12/31/2018 0431   LEUKOCYTESUR NEGATIVE 12/31/2018 0431     Radiological Exams on Admission: Ct Renal Stone Study  Result Date: 12/31/2018 CLINICAL DATA:  Flank pain with stone disease suspected, left-sided. EXAM: CT ABDOMEN AND PELVIS WITHOUT CONTRAST TECHNIQUE: Multidetector CT imaging of the abdomen and pelvis was performed following the standard protocol without IV contrast. COMPARISON:  None. FINDINGS: Lower chest:  No contributory findings. Hepatobiliary: No focal liver abnormality.No evidence of biliary obstruction or stone. Pancreas: Unremarkable. Spleen: Unremarkable. Adrenals/Urinary Tract: Negative adrenals. 5 mm stone at the lower pole right kidney. 7 x 6 mm proximal left ureteral stone with hydronephrosis, renal expansion, and perinephric edema. Cystic density from the lower pole right kidney. Unremarkable bladder. Stomach/Bowel: No obstruction. No appendicitis. Sigmoid diverticulosis. Vascular/Lymphatic: No acute vascular abnormality. Moderate atherosclerotic calcification of the aorta. No mass or adenopathy. Reproductive:Mild enlargement of the prostate up lifting the bladder base. Other: No ascites or pneumoperitoneum. Musculoskeletal: Spondylosis and facet arthropathy. Levoscoliosis of the thoracolumbar spine. IMPRESSION: 1. Obstructing 7 x 6 mm left proximal ureteral stone. 2. 5 mm right renal calculus. 3.  Aortic Atherosclerosis (ICD10-I70.0). 4. Sigmoid diverticulosis. Electronically Signed   By: Trevor Armstrong M.D.   On: 12/31/2018 04:06    EKG: Independently  reviewed.  Sinus rhythm with PVCs  Assessment/Plan  Principal Problem Obstructing left proximal ureteral stone -Urology consulted, EDP discussed with history of, will keep patient n.p.o. in case you need to go for stent placement -CT scan shows 7 x 6 mm sized stone, as well as 5 mm right renal calculus -He does have evidence of sigmoid diverticulosis no apparent diverticulitis findings  Active Problems Acute kidney injury -Unclear baseline, likely in the setting of #1, monitor renal function with fluids  Hypokalemia -Replete potassium, recheck tomorrow morning  Hyperlipidemia -Continue home statin   DVT prophylaxis: Hold pending possible procedure Code Status: Full code Family Communication: Wife present at bedside Disposition Plan: Home when ready Bed Type: MedSurg Consults called: Urology, message Dr. Lonna Cobb Obs/Inp: Clyda Hurdle, MD, PhD [facility]  Contact via [email or web address]  12/31/2018, 7:34 AM

## 2018-12-31 NOTE — ED Notes (Signed)
Breakfast tray provided to patient; declines at this time.

## 2019-01-01 ENCOUNTER — Observation Stay: Payer: Medicare Other

## 2019-01-01 ENCOUNTER — Other Ambulatory Visit: Payer: Self-pay

## 2019-01-01 ENCOUNTER — Encounter: Payer: Self-pay | Admitting: Internal Medicine

## 2019-01-01 ENCOUNTER — Encounter
Admission: EM | Disposition: A | Discharge status: Home / Self Care | Payer: Self-pay | Source: Home / Self Care | Attending: Emergency Medicine

## 2019-01-01 ENCOUNTER — Observation Stay: Payer: Medicare Other | Admitting: Anesthesiology

## 2019-01-01 DIAGNOSIS — N179 Acute kidney failure, unspecified: Secondary | ICD-10-CM | POA: Diagnosis present

## 2019-01-01 DIAGNOSIS — E785 Hyperlipidemia, unspecified: Secondary | ICD-10-CM | POA: Diagnosis not present

## 2019-01-01 DIAGNOSIS — N23 Unspecified renal colic: Secondary | ICD-10-CM | POA: Diagnosis present

## 2019-01-01 DIAGNOSIS — N139 Obstructive and reflux uropathy, unspecified: Secondary | ICD-10-CM | POA: Diagnosis not present

## 2019-01-01 DIAGNOSIS — N201 Calculus of ureter: Secondary | ICD-10-CM

## 2019-01-01 HISTORY — PX: CYSTOSCOPY/URETEROSCOPY/HOLMIUM LASER/STENT PLACEMENT: SHX6546

## 2019-01-01 LAB — COMPREHENSIVE METABOLIC PANEL
ALT: 17 U/L (ref 0–44)
AST: 21 U/L (ref 15–41)
Albumin: 3.3 g/dL — ABNORMAL LOW (ref 3.5–5.0)
Alkaline Phosphatase: 55 U/L (ref 38–126)
Anion gap: 8 (ref 5–15)
BUN: 22 mg/dL (ref 8–23)
CO2: 24 mmol/L (ref 22–32)
Calcium: 8.4 mg/dL — ABNORMAL LOW (ref 8.9–10.3)
Chloride: 108 mmol/L (ref 98–111)
Creatinine, Ser: 1.57 mg/dL — ABNORMAL HIGH (ref 0.61–1.24)
GFR calc Af Amer: 52 mL/min — ABNORMAL LOW (ref >60)
GFR calc non Af Amer: 45 mL/min — ABNORMAL LOW (ref >60)
Glucose, Bld: 97 mg/dL (ref 70–99)
Potassium: 3.7 mmol/L (ref 3.5–5.1)
Sodium: 140 mmol/L (ref 135–145)
Total Bilirubin: 0.9 mg/dL (ref 0.3–1.2)
Total Protein: 5.9 g/dL — ABNORMAL LOW (ref 6.5–8.1)

## 2019-01-01 LAB — CBC
HCT: 36.9 % — ABNORMAL LOW (ref 39.0–52.0)
Hemoglobin: 12.1 g/dL — ABNORMAL LOW (ref 13.0–17.0)
MCH: 29.9 pg (ref 26.0–34.0)
MCHC: 32.8 g/dL (ref 30.0–36.0)
MCV: 91.1 fL (ref 80.0–100.0)
Platelets: 170 10*3/uL (ref 150–400)
RBC: 4.05 MIL/uL — ABNORMAL LOW (ref 4.22–5.81)
RDW: 13 % (ref 11.5–15.5)
WBC: 8.6 10*3/uL (ref 4.0–10.5)
nRBC: 0 % (ref 0.0–0.2)

## 2019-01-01 SURGERY — CYSTOSCOPY/URETEROSCOPY/HOLMIUM LASER/STENT PLACEMENT
Anesthesia: General | Laterality: Left

## 2019-01-01 MED ORDER — LIDOCAINE HCL URETHRAL/MUCOSAL 2 % EX GEL
CUTANEOUS | Status: AC
  Filled 2019-01-01: qty 5

## 2019-01-01 MED ORDER — PROPOFOL 10 MG/ML IV BOLUS
INTRAVENOUS | Status: AC
  Filled 2019-01-01: qty 20

## 2019-01-01 MED ORDER — TAMSULOSIN HCL 0.4 MG PO CAPS
0.4000 mg | ORAL_CAPSULE | Freq: Every day | ORAL | Status: DC
  Administered 2019-01-01: 0.4 mg via ORAL
  Filled 2019-01-01: qty 1

## 2019-01-01 MED ORDER — FENTANYL CITRATE (PF) 100 MCG/2ML IJ SOLN
INTRAMUSCULAR | Status: AC
  Filled 2019-01-01: qty 2

## 2019-01-01 MED ORDER — OXYBUTYNIN CHLORIDE 5 MG PO TABS: 5.0000 mg | ORAL_TABLET | Freq: Three times a day (TID) | ORAL | 0 refills | Status: DC | PRN

## 2019-01-01 MED ORDER — TAMSULOSIN HCL 0.4 MG PO CAPS: 0.4000 mg | ORAL_CAPSULE | Freq: Every day | ORAL | 0 refills | Status: DC

## 2019-01-01 MED ORDER — FENTANYL CITRATE (PF) 100 MCG/2ML IJ SOLN: 25.0000 ug | INTRAMUSCULAR | Status: DC | PRN

## 2019-01-01 MED ORDER — LACTATED RINGERS IV SOLN
INTRAVENOUS | Status: DC
  Administered 2019-01-01: 14:00:00 via INTRAVENOUS

## 2019-01-01 MED ORDER — LIDOCAINE HCL (CARDIAC) PF 100 MG/5ML IV SOSY
PREFILLED_SYRINGE | INTRAVENOUS | Status: DC | PRN
  Administered 2019-01-01: 40 mg via INTRATRACHEAL
  Administered 2019-01-01: 60 mg via INTRATRACHEAL

## 2019-01-01 MED ORDER — MIDAZOLAM HCL 2 MG/2ML IJ SOLN
INTRAMUSCULAR | Status: AC
  Filled 2019-01-01: qty 2

## 2019-01-01 MED ORDER — ACETAMINOPHEN 160 MG/5ML PO SOLN
325.0000 mg | ORAL | Status: DC | PRN
  Filled 2019-01-01: qty 20.3

## 2019-01-01 MED ORDER — DEXAMETHASONE SODIUM PHOSPHATE 10 MG/ML IJ SOLN
INTRAMUSCULAR | Status: DC | PRN
  Administered 2019-01-01: 10 mg via INTRAVENOUS

## 2019-01-01 MED ORDER — HYDROCODONE-ACETAMINOPHEN 7.5-325 MG PO TABS
1.0000 | ORAL_TABLET | Freq: Once | ORAL | Status: DC | PRN
  Filled 2019-01-01: qty 1

## 2019-01-01 MED ORDER — FENTANYL CITRATE (PF) 100 MCG/2ML IJ SOLN
INTRAMUSCULAR | Status: DC | PRN
  Administered 2019-01-01 (×3): 25 ug via INTRAVENOUS

## 2019-01-01 MED ORDER — ACETAMINOPHEN 325 MG PO TABS: 325.0000 mg | ORAL_TABLET | ORAL | Status: DC | PRN

## 2019-01-01 MED ORDER — MEPERIDINE HCL 50 MG/ML IJ SOLN: 6.2500 mg | INTRAMUSCULAR | Status: DC | PRN

## 2019-01-01 MED ORDER — POLYETHYLENE GLYCOL 3350 17 G PO PACK
17.0000 g | PACK | Freq: Every day | ORAL | Status: DC | PRN
  Administered 2019-01-01: 17 g via ORAL
  Filled 2019-01-01: qty 1

## 2019-01-01 MED ORDER — PROPOFOL 10 MG/ML IV BOLUS
INTRAVENOUS | Status: DC | PRN
  Administered 2019-01-01: 150 mg via INTRAVENOUS

## 2019-01-01 MED ORDER — PROMETHAZINE HCL 25 MG/ML IJ SOLN: 6.2500 mg | INTRAMUSCULAR | Status: DC | PRN

## 2019-01-01 MED ORDER — EPHEDRINE SULFATE 50 MG/ML IJ SOLN
INTRAMUSCULAR | Status: DC | PRN
  Administered 2019-01-01: 5 mg via INTRAVENOUS
  Administered 2019-01-01: 10 mg via INTRAVENOUS
  Administered 2019-01-01: 5 mg via INTRAVENOUS

## 2019-01-01 MED ORDER — KETOROLAC TROMETHAMINE 30 MG/ML IJ SOLN: 30.0000 mg | Freq: Once | INTRAMUSCULAR | Status: DC | PRN

## 2019-01-01 MED ORDER — IOPAMIDOL (ISOVUE-200) INJECTION 41%
INTRAVENOUS | Status: DC | PRN
  Administered 2019-01-01: 40 mL

## 2019-01-01 MED ORDER — PROPOFOL 500 MG/50ML IV EMUL
INTRAVENOUS | Status: AC
  Filled 2019-01-01: qty 50

## 2019-01-01 MED ORDER — MIDAZOLAM HCL 2 MG/2ML IJ SOLN
INTRAMUSCULAR | Status: DC | PRN
  Administered 2019-01-01: 2 mg via INTRAVENOUS

## 2019-01-01 MED ORDER — OXYBUTYNIN CHLORIDE 5 MG PO TABS
5.0000 mg | ORAL_TABLET | Freq: Three times a day (TID) | ORAL | Status: DC | PRN
  Administered 2019-01-01: 5 mg via ORAL
  Filled 2019-01-01: qty 1

## 2019-01-01 SURGICAL SUPPLY — 29 items
BAG DRAIN CYSTO-URO LG1000N (MISCELLANEOUS) ×3 IMPLANT
BASKET ZERO TIP 1.9FR (BASKET) ×2 IMPLANT
BRUSH SCRUB EZ 1% IODOPHOR (MISCELLANEOUS) ×3 IMPLANT
CATH URETL 5X70 OPEN END (CATHETERS) IMPLANT
CNTNR SPEC 2.5X3XGRAD LEK (MISCELLANEOUS) ×1
CONT SPEC 4OZ STER OR WHT (MISCELLANEOUS) ×2
CONTAINER SPEC 2.5X3XGRAD LEK (MISCELLANEOUS) IMPLANT
DRAPE UTILITY 15X26 TOWEL STRL (DRAPES) ×3 IMPLANT
FIBER LASER TRAC TIP (UROLOGICAL SUPPLIES) IMPLANT
FIBER LASER TRACTIP 200 (UROLOGICAL SUPPLIES) ×2 IMPLANT
GLOVE BIO SURGEON STRL SZ8 (GLOVE) ×3 IMPLANT
GOWN STRL REUS W/ TWL LRG LVL3 (GOWN DISPOSABLE) ×1 IMPLANT
GOWN STRL REUS W/ TWL XL LVL3 (GOWN DISPOSABLE) ×1 IMPLANT
GOWN STRL REUS W/TWL LRG LVL3 (GOWN DISPOSABLE) ×2
GOWN STRL REUS W/TWL XL LVL3 (GOWN DISPOSABLE) ×2
GUIDEWIRE STR DUAL SENSOR (WIRE) ×5 IMPLANT
INFUSOR MANOMETER BAG 3000ML (MISCELLANEOUS) ×3 IMPLANT
INTRODUCER DILATOR DOUBLE (INTRODUCER) ×2 IMPLANT
KIT TURNOVER CYSTO (KITS) ×3 IMPLANT
PACK CYSTO AR (MISCELLANEOUS) ×3 IMPLANT
SET CYSTO W/LG BORE CLAMP LF (SET/KITS/TRAYS/PACK) ×3 IMPLANT
SHEATH URETERAL 12FRX35CM (MISCELLANEOUS) IMPLANT
SOL .9 NS 3000ML IRR  AL (IV SOLUTION) ×2
SOL .9 NS 3000ML IRR UROMATIC (IV SOLUTION) ×1 IMPLANT
STENT URET 6FRX24 CONTOUR (STENTS) IMPLANT
STENT URET 6FRX26 CONTOUR (STENTS) ×2 IMPLANT
SURGILUBE 2OZ TUBE FLIPTOP (MISCELLANEOUS) ×3 IMPLANT
VALVE UROSEAL ADJ ENDO (VALVE) ×2 IMPLANT
WATER STERILE IRR 1000ML POUR (IV SOLUTION) ×3 IMPLANT

## 2019-01-01 NOTE — Transfer of Care (Signed)
Immediate Anesthesia Transfer of Care Note  Patient: Trevor Armstrong  Procedure(s) Performed: CYSTOSCOPY/URETEROSCOPY/HOLMIUM LASER/STENT PLACEMENT (Left )  Patient Location: PACU  Anesthesia Type:General  Level of Consciousness: awake, alert  and oriented  Airway & Oxygen Therapy: Patient Spontanous Breathing and Patient connected to face mask oxygen  Post-op Assessment: Report given to RN and Post -op Vital signs reviewed and stable  Post vital signs: Reviewed and stable  Last Vitals:  Vitals Value Taken Time  BP 137/82 01/01/19 1451  Temp 35.9 C 01/01/19 1451  Pulse 81 01/01/19 1455  Resp 15 01/01/19 1455  SpO2 99 % 01/01/19 1455  Vitals shown include unvalidated device data.  Last Pain:  Vitals:   01/01/19 1451  TempSrc:   PainSc: 0-No pain         Complications: No apparent anesthesia complications

## 2019-01-01 NOTE — Interval H&P Note (Signed)
History and Physical Interval Note:  01/01/2019 1:32 PM  Trevor Armstrong  has presented today for surgery, with the diagnosis of 7 mm left proximal ureteral calculus.  The various methods of treatment have been discussed with the patient and family. After consideration of risks, benefits and other options for treatment, the patient has consented to  Procedure(s): CYSTOSCOPY/URETEROSCOPY/HOLMIUM LASER/STENT PLACEMENT (Left) as a surgical intervention.  The patient's history has been reviewed, patient examined, no change in status, stable for surgery.  I have reviewed the patient's chart and labs.  Questions were answered to the patient's satisfaction.     [place]

## 2019-01-01 NOTE — Op Note (Signed)
Preoperative diagnosis: Left proximal ureteral calculus  Postoperative diagnosis: Same   Procedure:  1. Cystoscopy 2. Left ureteroscopy and stone removal 3. Ureteroscopic laser lithotripsy 4. Left ureteral stent placement 6FR 26 cm 5. Left retrograde pyelography with interpretation  Surgeon: Nicki Reaper C. Laniah Grimm, M.D.  Anesthesia: General  Complications: None  Intraoperative findings:  1.  Left retrograde pyelography post procedure showed no filling defects, stone fragments or contrast extravasation  EBL: Minimal  Specimens: 1. Calculus fragments for analysis   Indication: Trevor Armstrong is a 68 y.o. male who presented to the ED 12/31/2018 with severe renal colic secondary to a 6 mm left proximal ureteral calculus.  Pain was not adequately controlled in the ED and he was admitted for pain control.  He received Toradol and the stone was within the renal shadow making shockwave lithotripsy a delayed option.  After reviewing the management options for treatment, the patient elected to proceed with the above surgical procedure(s). We have discussed the potential benefits and risks of the procedure, side effects of the proposed treatment, the likelihood of the patient achieving the goals of the procedure, and any potential problems that might occur during the procedure or recuperation. Informed consent has been obtained.  Description of procedure:  The patient was taken to the operating room and general anesthesia was induced.  The patient was placed in the dorsal lithotomy position, prepped and draped in the usual sterile fashion, and preoperative antibiotics were administered. A preoperative time-out was performed.   A 21 French cystoscope was lubricated and passed under direct vision.  The urethra was normal in caliber without stricture.  The prostate demonstrated moderate lateral lobe enlargement..  Panendoscopy was performed and the bladder mucosa showed no erythema, solid or papillary  lesions.  Attention was directed to the left ureteral orifice and a 0.038 Sensor wire was then advanced up the ureter into the renal pelvis under fluoroscopic guidance.  With the passage of the guidewire the calculus was noted to migrate to the kidney.  The cystoscope was removed and a dual-lumen catheter was placed over the sensor wire and a second sensor wire was placed in a similar fashion.  A 12/14 French ureteral access sheath was placed over the working wire under fluoroscopic guidance without difficulty.  A digital flexible ureteroscope was placed through the access sheath and advanced into the renal pelvis without difficulty.  The calculus was identified in a midpole calyx.  No calculus was noted in the proximal ureter  A 200 micron holmium laser fiber was placed through the ureteroscope and the stone was fragmented at a setting of 0.4 J and frequency of 40 hz.  The stone was hard and did not dust but cracked into several fragments  All stone fragments were then removed from the collecting system with a zero tip nitinol basket.  1 fragment was too large to negotiate the UPJ and was deposited in an upper pole calyx and further fragmented with holmium laser.  These fragments were then removed with the basket.  Retrograde pyelogram was performed with findings as described above.  Each calyx was sequentially examined under fluoroscopic guidance and no significant size fragments were identified.  The ureteral access sheath and ureteroscope were removed in tandem and the ureter showed no evidence of injury or perforation.  A 6 FR/26 CM stent was was placed under fluoroscopic guidance.  The wire was then removed with an adequate stent curl noted in the bladder.  The proximal tip resided within an upper pole calyx.  The bladder was then emptied and the procedure ended.  The patient appeared to tolerate the procedure well and without complications.  After anesthetic reversal the patient was  transported to the PACU in stable condition.   Plan: Follow-up will be scheduled in 1 week for cystoscopy with stent removal in the office.   Irineo Axon, MD

## 2019-01-01 NOTE — Anesthesia Procedure Notes (Signed)
Procedure Name: LMA Insertion Date/Time: 01/01/2019 1:55 PM Performed by: Allean Found, CRNA Pre-anesthesia Checklist: Patient identified, Patient being monitored, Timeout performed, Emergency Drugs available and Suction available Patient Re-evaluated:Patient Re-evaluated prior to induction Oxygen Delivery Method: Circle system utilized Preoxygenation: Pre-oxygenation with 100% oxygen Induction Type: IV induction Ventilation: Mask ventilation without difficulty LMA: LMA inserted LMA Size: 5.0 Tube type: Oral Number of attempts: 1 Placement Confirmation: positive ETCO2 and breath sounds checked- equal and bilateral Tube secured with: Tape Dental Injury: Teeth and Oropharynx as per pre-operative assessment  Comments: Lubricated LMA with two tubes of Lidocaine jelly

## 2019-01-01 NOTE — Anesthesia Post-op Follow-up Note (Signed)
Anesthesia QCDR form completed.        

## 2019-01-01 NOTE — Plan of Care (Signed)
The patient has been discharged. Educated upon discharged. IV removed.  Problem: Education: Goal: Knowledge of General Education information will improve Description: Including pain rating scale, medication(s)/side effects and non-pharmacologic comfort measures Outcome: Completed/Met   Problem: Health Behavior/Discharge Planning: Goal: Ability to manage health-related needs will improve Outcome: Completed/Met   Problem: Health Behavior/Discharge Planning: Goal: Ability to manage health-related needs will improve Outcome: Completed/Met   Problem: Clinical Measurements: Goal: Ability to maintain clinical measurements within normal limits will improve Outcome: Completed/Met Goal: Will remain free from infection Outcome: Completed/Met Goal: Diagnostic test results will improve Outcome: Completed/Met Goal: Respiratory complications will improve Outcome: Completed/Met Goal: Cardiovascular complication will be avoided Outcome: Completed/Met   Problem: Coping: Goal: Level of anxiety will decrease Outcome: Completed/Met   Problem: Nutrition: Goal: Adequate nutrition will be maintained Outcome: Completed/Met   Problem: Activity: Goal: Risk for activity intolerance will decrease Outcome: Completed/Met   Problem: Elimination: Goal: Will not experience complications related to bowel motility Outcome: Completed/Met Goal: Will not experience complications related to urinary retention Outcome: Completed/Met   Problem: Pain Managment: Goal: General experience of comfort will improve Outcome: Completed/Met   Problem: Safety: Goal: Ability to remain free from injury will improve Outcome: Completed/Met   Problem: Skin Integrity: Goal: Risk for impaired skin integrity will decrease Outcome: Completed/Met

## 2019-01-01 NOTE — Care Management Obs Status (Signed)
[place] NOTIFICATION   Patient Details  Name: Trevor Armstrong MRN: [id number] Date of Birth: October 26, 1950   Medicare Observation Status Notification Given:  Yes    Shade Flood, LCSW 01/01/2019, 4:39 PM

## 2019-01-01 NOTE — Discharge Summary (Signed)
Physician Discharge Summary  Rosiland Ozndrew Goedde ZOX:[id number] DOB: 1950-04-21 DOA: 12/31/2018  PCP: Wilkie AyeShaner, Brian Allen, MD  Admit date: 12/31/2018 Discharge date: 01/01/2019  Admitted From: home Disposition:  home  Recommendations for Outpatient Follow-up:  1. Repeat BMET in 1 week 2. Follow up with PCP in 2 weeks 3. Follow up with urology in 2 weeks  Discharge Condition:stable CODE STATUS: full code Diet recommendation: regular diet  Brief/Interim Summary: 68 y/o male with history of hyperlipidemia presented to the hospital with left flank pain as well as nausea and vomiting. He was found to have obstructing left proximal ureteral stone. He had a mildly elevated creatinine. He was admitted for further evaluation. He was seen by urology and underwent cystoscopy with stent placement. He tolerated the procedure well. Post operatively, his pain has significantly improved and nausea and vomiting have resolved. He has been started on flomax and oxybutynin for bladder spasms. He will follow up with urology as an outpatient  Creatinine was mildly elevated at 1.5 with GFR of 45. In 06/2018, GFR was noted to be 57 with creatinine 1.3. This can be further followed up as an outpatient.  Patient is feeling better and feels ready to discharge home. The remainder of his medical issues remained stable.   Discharge Diagnoses:  Active Problems:   Obstructive uropathy   Hyperlipidemia   Ureteral colic   AKI (acute kidney injury) [facility](HCC)    Discharge Instructions  Discharge Instructions    Diet - low sodium heart healthy   Complete by: As directed    Increase activity slowly   Complete by: As directed      Allergies as of 01/01/2019      Reactions   Sulfa Antibiotics       Medication List    TAKE these medications   atorvastatin 10 MG tablet Commonly known as: LIPITOR Take 10 mg by mouth daily.   NON FORMULARY Take 4.5 mg by mouth daily.   oxybutynin 5 MG tablet Commonly known as:  DITROPAN Take 1 tablet (5 mg total) by mouth every 8 (eight) hours as needed for bladder spasms.   tamsulosin 0.4 MG Caps capsule Commonly known as: FLOMAX Take 1 capsule (0.4 mg total) by mouth daily after supper.   valACYclovir 1000 MG tablet Commonly known as: VALTREX Take 1,000 mg by mouth 2 (two) times daily.      Follow-up Information    Stoioff, Verna CzechScott C, MD Follow up.   Specialty: Urology Why: call for appointment in 2 weeks  Contact information: [address] [place] [address] [phone]          Allergies  Allergen Reactions  . Sulfa Antibiotics     Consultations:  urology   Procedures/Studies: DG OR UROLOGY CYSTO IMAGE ([facility] ONLY)  Result Date: 01/01/2019 There is no interpretation for this exam.  This order is for images obtained during a surgical procedure.  Please See "Surgeries" Tab for more information regarding the procedure.   DG OR UROLOGY CYSTO IMAGE ([facility] ONLY)  Result Date: 01/01/2019 There is no interpretation for this exam.  This order is for images obtained during a surgical procedure.  Please See "Surgeries" Tab for more information regarding the procedure.   CT Renal Stone Study  Result Date: 12/31/2018 CLINICAL DATA:  Flank pain with stone disease suspected, left-sided. EXAM: CT ABDOMEN AND PELVIS WITHOUT CONTRAST TECHNIQUE: Multidetector CT imaging of the abdomen and pelvis was performed following the standard protocol without IV contrast. COMPARISON:  None.  FINDINGS: Lower chest:  No contributory findings. Hepatobiliary: No focal liver abnormality.No evidence of biliary obstruction or stone. Pancreas: Unremarkable. Spleen: Unremarkable. Adrenals/Urinary Tract: Negative adrenals. 5 mm stone at the lower pole right kidney. 7 x 6 mm proximal left ureteral stone with hydronephrosis, renal expansion, and perinephric edema. Cystic density from the lower pole right kidney. Unremarkable bladder. Stomach/Bowel: No  obstruction. No appendicitis. Sigmoid diverticulosis. Vascular/Lymphatic: No acute vascular abnormality. Moderate atherosclerotic calcification of the aorta. No mass or adenopathy. Reproductive:Mild enlargement of the prostate up lifting the bladder base. Other: No ascites or pneumoperitoneum. Musculoskeletal: Spondylosis and facet arthropathy. Levoscoliosis of the thoracolumbar spine. IMPRESSION: 1. Obstructing 7 x 6 mm left proximal ureteral stone. 2. 5 mm right renal calculus. 3.  Aortic Atherosclerosis (ICD10-I70.0). 4. Sigmoid diverticulosis. Electronically Signed   By: Marnee Spring M.D.   On: 12/31/2018 04:06      Subjective: No further flank pain, nausea or vomiting.  Discharge Exam: Vitals:   01/01/19 1521 01/01/19 1531 01/01/19 1549 01/01/19 1616  BP: (!) 141/66 134/84 (!) 142/92 (!) 161/97  Pulse: (!) 44 81 71 61  Resp: 17 17  20   Temp:  (!) 96.9 F (36.1 C) 98.6 F (37 C) 97.6 F (36.4 C)  TempSrc:   Oral Oral  SpO2: 93%  95% 97%  Weight:      Height:        General: Pt is alert, awake, not in acute distress Cardiovascular: RRR, S1/S2 +, no rubs, no gallops Respiratory: CTA bilaterally, no wheezing, no rhonchi Abdominal: Soft, NT, ND, bowel sounds + Extremities: no edema, no cyanosis    The results of significant diagnostics from this hospitalization (including imaging, microbiology, ancillary and laboratory) are listed below for reference.     Microbiology: Recent Results (from the past 240 hour(s))  Respiratory Panel by RT PCR (Flu A&B, Covid) - Nasopharyngeal Swab     Status: None   Collection Time: 12/31/18  6:07 AM   Specimen: Nasopharyngeal Swab  Result Value Ref Range Status   SARS Coronavirus 2 by RT PCR NEGATIVE NEGATIVE Final    Comment: (NOTE) SARS-CoV-2 target nucleic acids are NOT DETECTED. The SARS-CoV-2 RNA is generally detectable in upper respiratoy specimens during the acute phase of infection. The lowest concentration of SARS-CoV-2 viral  copies this assay can detect is 131 copies/mL. A negative result does not preclude SARS-Cov-2 infection and should not be used as the sole basis for treatment or other patient management decisions. A negative result may occur with  improper specimen collection/handling, submission of specimen other than nasopharyngeal swab, presence of viral mutation(s) within the areas targeted by this assay, and inadequate number of viral copies (<131 copies/mL). A negative result must be combined with clinical observations, patient history, and epidemiological information. The expected result is Negative. Fact Sheet for Patients:  14/09/20 Fact Sheet for Healthcare Providers:  https://www.moore.com/ This test is not yet ap proved or cleared by the https://www.young.biz/ FDA and  has been authorized for detection and/or diagnosis of SARS-CoV-2 by FDA under an Emergency Use Authorization (EUA). This EUA will remain  in effect (meaning this test can be used) for the duration of the COVID-19 declaration under Section 564(b)(1) of the Act, 21 U.S.C. section 360bbb-3(b)(1), unless the authorization is terminated or revoked sooner.    Influenza A by PCR NEGATIVE NEGATIVE Final   Influenza B by PCR NEGATIVE NEGATIVE Final    Comment: (NOTE) The Xpert Xpress SARS-CoV-2/FLU/RSV assay is intended as an aid in  the  diagnosis of influenza from Nasopharyngeal swab specimens and  should not be used as a sole basis for treatment. Nasal washings and  aspirates are unacceptable for Xpert Xpress SARS-CoV-2/FLU/RSV  testing. Fact Sheet for Patients: https://www.moore.com/ Fact Sheet for Healthcare Providers: https://www.young.biz/ This test is not yet approved or cleared by the [place] FDA and  has been authorized for detection and/or diagnosis of SARS-CoV-2 by  FDA under an Emergency Use Authorization (EUA). This EUA will  remain  in effect (meaning this test can be used) for the duration of the  Covid-19 declaration under Section 564(b)(1) of the Act, 21  U.S.C. section 360bbb-3(b)(1), unless the authorization is  terminated or revoked. Performed at [facility], [address] [place], [place] [address]      Labs: BNP (last 3 results) No results for input(s): BNP in the last 8760 hours. Basic Metabolic Panel: Recent Labs  Lab 12/31/18 0327 01/01/19 0448  NA 144 140  K 3.4* 3.7  CL 105 108  CO2 23 24  GLUCOSE 185* 97  BUN 20 22  CREATININE 1.50* 1.57*  CALCIUM 9.4 8.4*   Liver Function Tests: Recent Labs  Lab 12/31/18 0327 01/01/19 0448  AST 27 21  ALT 25 17  ALKPHOS 74 55  BILITOT 0.7 0.9  PROT 7.8 5.9*  ALBUMIN 4.3 3.3*   Recent Labs  Lab 12/31/18 0327  LIPASE 28   No results for input(s): AMMONIA in the last 168 hours. CBC: Recent Labs  Lab 12/31/18 0327 01/01/19 0448  WBC 11.7* 8.6  NEUTROABS 8.3*  --   HGB 14.3 12.1*  HCT 43.1 36.9*  MCV 90.2 91.1  PLT 222 170   Cardiac Enzymes: No results for input(s): CKTOTAL, CKMB, CKMBINDEX, TROPONINI in the last 168 hours. BNP: Invalid input(s): POCBNP CBG: No results for input(s): GLUCAP in the last 168 hours. D-Dimer No results for input(s): DDIMER in the last 72 hours. Hgb A1c No results for input(s): HGBA1C in the last 72 hours. Lipid Profile No results for input(s): CHOL, HDL, LDLCALC, TRIG, CHOLHDL, LDLDIRECT in the last 72 hours. Thyroid function studies No results for input(s): TSH, T4TOTAL, T3FREE, THYROIDAB in the last 72 hours.  Invalid input(s): FREET3 Anemia work up No results for input(s): VITAMINB12, FOLATE, FERRITIN, TIBC, IRON, RETICCTPCT in the last 72 hours. Urinalysis    Component Value Date/Time   COLORURINE YELLOW (A) 12/31/2018 0431   APPEARANCEUR HAZY (A) 12/31/2018 0431   LABSPEC 1.017 12/31/2018 0431   PHURINE 7.0 12/31/2018 0431   GLUCOSEU 50 (A) 12/31/2018 0431   HGBUR  NEGATIVE 12/31/2018 0431   BILIRUBINUR NEGATIVE 12/31/2018 0431   KETONESUR 20 (A) 12/31/2018 0431   PROTEINUR NEGATIVE 12/31/2018 0431   NITRITE NEGATIVE 12/31/2018 0431   LEUKOCYTESUR NEGATIVE 12/31/2018 0431   Sepsis Labs Invalid input(s): PROCALCITONIN,  WBC,  LACTICIDVEN Microbiology Recent Results (from the past 240 hour(s))  Respiratory Panel by RT PCR (Flu A&B, Covid) - Nasopharyngeal Swab     Status: None   Collection Time: 12/31/18  6:07 AM   Specimen: Nasopharyngeal Swab  Result Value Ref Range Status   SARS Coronavirus 2 by RT PCR NEGATIVE NEGATIVE Final    Comment: (NOTE) SARS-CoV-2 target nucleic acids are NOT DETECTED. The SARS-CoV-2 RNA is generally detectable in upper respiratoy specimens during the acute phase of infection. The lowest concentration of SARS-CoV-2 viral copies this assay can detect is 131 copies/mL. A negative result does not preclude SARS-Cov-2 infection and should not be used as the sole  basis for treatment or other patient management decisions. A negative result may occur with  improper specimen collection/handling, submission of specimen other than nasopharyngeal swab, presence of viral mutation(s) within the areas targeted by this assay, and inadequate number of viral copies (<131 copies/mL). A negative result must be combined with clinical observations, patient history, and epidemiological information. The expected result is Negative. Fact Sheet for Patients:  PinkCheek.be Fact Sheet for Healthcare Providers:  GravelBags.it This test is not yet ap proved or cleared by the [place] FDA and  has been authorized for detection and/or diagnosis of SARS-CoV-2 by FDA under an Emergency Use Authorization (EUA). This EUA will remain  in effect (meaning this test can be used) for the duration of the COVID-19 declaration under Section 564(b)(1) of the Act, 21 U.S.C. section 360bbb-3(b)(1),  unless the authorization is terminated or revoked sooner.    Influenza A by PCR NEGATIVE NEGATIVE Final   Influenza B by PCR NEGATIVE NEGATIVE Final    Comment: (NOTE) The Xpert Xpress SARS-CoV-2/FLU/RSV assay is intended as an aid in  the diagnosis of influenza from Nasopharyngeal swab specimens and  should not be used as a sole basis for treatment. Nasal washings and  aspirates are unacceptable for Xpert Xpress SARS-CoV-2/FLU/RSV  testing. Fact Sheet for Patients: PinkCheek.be Fact Sheet for Healthcare Providers: GravelBags.it This test is not yet approved or cleared by the [place] FDA and  has been authorized for detection and/or diagnosis of SARS-CoV-2 by  FDA under an Emergency Use Authorization (EUA). This EUA will remain  in effect (meaning this test can be used) for the duration of the  Covid-19 declaration under Section 564(b)(1) of the Act, 21  U.S.C. section 360bbb-3(b)(1), unless the authorization is  terminated or revoked. Performed at [facility], [address], [place], [place] [address]      Time coordinating discharge: 53mins  SIGNED:   Kathie Dike, MD  [facility] 01/01/2019, 5:04 PM   If 7PM-7AM, please contact night-coverage [email or web address]

## 2019-01-01 NOTE — Anesthesia Preprocedure Evaluation (Signed)
Anesthesia Evaluation  Patient identified by MRN, date of birth, ID band Patient awake    Reviewed: Allergy & Precautions, H&P , NPO status , reviewed documented beta blocker date and time   Airway Mallampati: II  TM Distance: >3 FB Neck ROM: full    Dental  (+) Teeth Intact   Pulmonary    Pulmonary exam normal        Cardiovascular Normal cardiovascular exam  PVCs/Bigemminy on EKG, No Hx cardiac issues, works out at gym   Neuro/Psych    GI/Hepatic GERD  Controlled,  Endo/Other    Renal/GU      Musculoskeletal  (+) Arthritis ,   Abdominal   Peds  Hematology   Anesthesia Other Findings History reviewed. No pertinent past medical history. Past Surgical History: Colonoscopy No date: EYE SURGERY BMI    Body Mass Index: 33.97 kg/m     Reproductive/Obstetrics                             Anesthesia Physical Anesthesia Plan  ASA: II  Anesthesia Plan: General   Post-op Pain Management:    Induction: Intravenous  PONV Risk Score and Plan: Ondansetron and Treatment may vary due to age or medical condition  Airway Management Planned: LMA  Additional Equipment:   Intra-op Plan:   Post-operative Plan: Extubation in OR  Informed Consent: I have reviewed the patients History and Physical, chart, labs and discussed the procedure including the risks, benefits and alternatives for the proposed anesthesia with the patient or authorized representative who has indicated his/her understanding and acceptance.     Dental Advisory Given  Plan Discussed with: CRNA  Anesthesia Plan Comments:         Anesthesia Quick Evaluation

## 2019-01-02 NOTE — Anesthesia Postprocedure Evaluation (Signed)
Anesthesia Post Note  Patient: Trevor Armstrong  Procedure(s) Performed: CYSTOSCOPY/URETEROSCOPY/HOLMIUM LASER/STENT PLACEMENT (Left )  Patient location during evaluation: PACU Anesthesia Type: General Level of consciousness: awake and alert Pain management: pain level controlled Vital Signs Assessment: post-procedure vital signs reviewed and stable Respiratory status: spontaneous breathing, nonlabored ventilation and respiratory function stable Cardiovascular status: blood pressure returned to baseline and stable Postop Assessment: no apparent nausea or vomiting Anesthetic complications: no     Last Vitals:  Vitals:   01/01/19 1616 01/01/19 1733  BP: (!) 161/97 131/75  Pulse: 61 60  Resp: 20 16  Temp: 36.4 C (!) 36.4 C  SpO2: 97% 94%    Last Pain:  Vitals:   01/01/19 1733  TempSrc: Oral  PainSc:                  Alphonsus Sias

## 2019-01-02 NOTE — Anesthesia Postprocedure Evaluation (Signed)
Anesthesia Post Note  Patient: Trevor Armstrong  Procedure(s) Performed: CYSTOSCOPY/URETEROSCOPY/HOLMIUM LASER/STENT PLACEMENT (Left )  Anesthesia Post Evaluation   Last Vitals:  Vitals:   01/01/19 1616 01/01/19 1733  BP: (!) 161/97 131/75  Pulse: 61 60  Resp: 20 16  Temp: 36.4 C (!) 36.4 C  SpO2: 97% 94%    Last Pain:  Vitals:   01/01/19 1733  TempSrc: Oral  PainSc:                  Alphonsus Sias

## 2019-01-08 ENCOUNTER — Other Ambulatory Visit: Payer: Self-pay

## 2019-01-08 ENCOUNTER — Encounter: Payer: Self-pay | Admitting: Urology

## 2019-01-08 ENCOUNTER — Ambulatory Visit: Payer: Medicare Other | Admitting: Urology

## 2019-01-08 VITALS — BP 127/83 | HR 103 | Ht 68.0 in | Wt 228.4 lb

## 2019-01-08 DIAGNOSIS — Z86010 Personal history of colonic polyps: Secondary | ICD-10-CM | POA: Insufficient documentation

## 2019-01-08 DIAGNOSIS — L409 Psoriasis, unspecified: Secondary | ICD-10-CM | POA: Insufficient documentation

## 2019-01-08 DIAGNOSIS — Z9889 Other specified postprocedural states: Secondary | ICD-10-CM

## 2019-01-08 DIAGNOSIS — N139 Obstructive and reflux uropathy, unspecified: Secondary | ICD-10-CM | POA: Diagnosis not present

## 2019-01-08 DIAGNOSIS — N200 Calculus of kidney: Secondary | ICD-10-CM

## 2019-01-08 DIAGNOSIS — K219 Gastro-esophageal reflux disease without esophagitis: Secondary | ICD-10-CM | POA: Insufficient documentation

## 2019-01-08 DIAGNOSIS — F32A Depression, unspecified: Secondary | ICD-10-CM | POA: Insufficient documentation

## 2019-01-08 DIAGNOSIS — K5792 Diverticulitis of intestine, part unspecified, without perforation or abscess without bleeding: Secondary | ICD-10-CM | POA: Insufficient documentation

## 2019-01-08 DIAGNOSIS — F419 Anxiety disorder, unspecified: Secondary | ICD-10-CM | POA: Insufficient documentation

## 2019-01-08 DIAGNOSIS — F329 Major depressive disorder, single episode, unspecified: Secondary | ICD-10-CM | POA: Insufficient documentation

## 2019-01-08 LAB — URINALYSIS, COMPLETE
Bilirubin, UA: NEGATIVE
Glucose, UA: NEGATIVE
Ketones, UA: NEGATIVE
Nitrite, UA: NEGATIVE
Specific Gravity, UA: 1.02 (ref 1.005–1.030)
Urobilinogen, Ur: 0.2 mg/dL (ref 0.2–1.0)
pH, UA: 5.5 (ref 5.0–7.5)

## 2019-01-08 LAB — MICROSCOPIC EXAMINATION
Bacteria, UA: NONE SEEN
RBC, Urine: >30 /hpf — AB (ref 0–2)

## 2019-01-08 MED ORDER — CIPROFLOXACIN HCL 500 MG PO TABS
500.0000 mg | ORAL_TABLET | Freq: Once | ORAL | Status: AC
  Administered 2019-01-08: 500 mg via ORAL

## 2019-01-08 MED ORDER — LIDOCAINE HCL URETHRAL/MUCOSAL 2 % EX GEL
1.0000 "application " | Freq: Once | CUTANEOUS | Status: AC
  Administered 2019-01-08: 1 via URETHRAL

## 2019-01-08 NOTE — Progress Notes (Signed)
Indications: Patient is 68 y.o., male admitted last week with severe renal colic secondary to a 6 mm left proximal ureteral calculus.  He underwent ureteroscopic removal on 01/01/2019.  He denies postoperative problems other than mild to moderate stent irritation. The patient is presenting today for stent removal.  Procedure:  Flexible Cystoscopy with stent removal (09470)  Timeout was performed and the correct patient, procedure and participants were identified.    Description:  The patient was prepped and draped in the usual sterile fashion. Flexible cystosopy was performed.  The stent was visualized, grasped, and removed intact without difficulty. The patient tolerated the procedure well.  A single dose of oral antibiotics was given.  Complications:  None  Plan: He was directed to call for flank pain or fever post stent removal.  He will otherwise follow-up in 1 month for review of stone analysis and discussion of stone prevention measures.

## 2019-01-08 NOTE — Patient Instructions (Signed)

## 2019-01-09 LAB — CALCULI, WITH PHOTOGRAPH (CLINICAL LAB)
Calcium Oxalate Monohydrate: 100 %
Weight Calculi: 91 mg

## 2019-02-11 ENCOUNTER — Other Ambulatory Visit: Payer: Self-pay

## 2019-02-11 ENCOUNTER — Ambulatory Visit: Payer: Medicare PPO | Admitting: Urology

## 2019-02-11 ENCOUNTER — Encounter: Payer: Self-pay | Admitting: Urology

## 2019-02-11 VITALS — BP 129/79 | HR 61 | Ht 69.0 in | Wt 230.0 lb

## 2019-02-11 DIAGNOSIS — N200 Calculus of kidney: Secondary | ICD-10-CM | POA: Insufficient documentation

## 2019-02-11 MED ORDER — TAMSULOSIN HCL 0.4 MG PO CAPS: 0.4000 mg | ORAL_CAPSULE | Freq: Every day | ORAL | 0 refills | Status: DC

## 2019-02-11 NOTE — Progress Notes (Signed)
02/11/2019 8:53 AM   [name] Oct 30, 1950 [id number]  Referring provider: Fernanda Drum, [place],  [place] [address]  Chief Complaint  Patient presents with  . Follow-up   Urologic history: 1.  Nephrolithiasis -Admitted 12/31/2018 intractable renal colic secondary to 7 mm left proximal ureteral calculus -Ureteroscopic removal 01/01/2019 -Stent removed 12/17 -Nonobstructing right renal calculus   HPI: 69 y.o. male presents for follow-up.  He had no problems after stent removal and has no complaints today.  Stone analysis was 100% calcium oxalate monohydrate.  He also had a 5 mm nonobstructing lower pole renal calculus.   PMH: Past Medical History:  Diagnosis Date  . Kidney stone     Surgical History: Past Surgical History:  Procedure Laterality Date  . CYSTOSCOPY/URETEROSCOPY/HOLMIUM LASER/STENT PLACEMENT Left 01/01/2019   Procedure: CYSTOSCOPY/URETEROSCOPY/HOLMIUM LASER/STENT PLACEMENT;  Surgeon: Abbie Sons, MD;  Location: [facility] ORS;  Service: Urology;  Laterality: Left;  . EYE SURGERY      Home Medications:  Allergies as of 02/11/2019      Reactions   Sulfa Antibiotics       Medication List       Accurate as of February 11, 2019  8:53 AM. If you have any questions, ask your nurse or doctor.        atorvastatin 10 MG tablet Commonly known as: LIPITOR Take 10 mg by mouth daily.   NON FORMULARY Take 4.5 mg by mouth daily.   oxybutynin 5 MG tablet Commonly known as: DITROPAN Take 1 tablet (5 mg total) by mouth every 8 (eight) hours as needed for bladder spasms.   tamsulosin 0.4 MG Caps capsule Commonly known as: FLOMAX Take 1 capsule (0.4 mg total) by mouth daily after supper.   valACYclovir 1000 MG tablet Commonly known as: VALTREX Take 1,000 mg by mouth 2 (two) times daily.       Allergies:  Allergies  Allergen Reactions  . Sulfa Antibiotics     Family History: No family history on file.  Social  History:  reports that he has never smoked. He has never used smokeless tobacco. He reports that he does not drink alcohol or use drugs.  ROS: UROLOGY Frequent Urination?: No Hard to postpone urination?: No Burning/pain with urination?: No Get up at night to urinate?: Yes Leakage of urine?: No Urine stream starts and stops?: No Trouble starting stream?: No Do you have to strain to urinate?: No Blood in urine?: No Urinary tract infection?: No Sexually transmitted disease?: No Injury to kidneys or bladder?: No Painful intercourse?: No Weak stream?: No Erection problems?: No Penile pain?: No  Gastrointestinal Nausea?: No Vomiting?: No Indigestion/heartburn?: No Diarrhea?: No Constipation?: No  Constitutional Fever: No Night sweats?: No Weight loss?: No Fatigue?: No  Skin Skin rash/lesions?: No Itching?: No  Eyes Blurred vision?: No Double vision?: No  Ears/Nose/Throat Sore throat?: No Sinus problems?: No  Hematologic/Lymphatic Swollen glands?: No Easy bruising?: No  Cardiovascular Leg swelling?: No Chest pain?: No  Respiratory Cough?: No Shortness of breath?: No  Endocrine Excessive thirst?: No  Musculoskeletal Back pain?: No Joint pain?: Yes  Neurological Headaches?: No Dizziness?: No  Psychologic Depression?: No Anxiety?: No  Physical Exam: BP 129/79   Pulse 61   Ht 5\' 9"  (1.753 m)   Wt 230 lb (104.3 kg)   BMI 33.97 kg/m   Constitutional:  Alert and oriented, No acute distress. HEENT: [place] AT, moist mucus membranes.  Trachea midline, no masses. Cardiovascular: No clubbing, cyanosis, or  edema. Respiratory: Normal respiratory effort, no increased work of breathing. Skin: No rashes, bruises or suspicious lesions. Neurologic: Grossly intact, no focal deficits, moving all 4 extremities. Psychiatric: Normal mood and affect.   Assessment & Plan:    - Nephrolithiasis Doing well status post ureteroscopic stone removal. He has a  nonobstructing right renal caps.  We discussed management options of observation with periodic KUB and shockwave lithotripsy.  He may be interested in shockwave lithotripsy and a KUB was ordered.  I recommended pursuing a metabolic evaluation to include 24-hour urine study and blood work.  He will be notified with results   Riki Altes, MD  [facility] [address] [place], [place] [address] [phone]

## 2019-03-20 ENCOUNTER — Ambulatory Visit: Payer: Medicare PPO | Attending: Internal Medicine

## 2019-03-20 DIAGNOSIS — Z23 Encounter for immunization: Secondary | ICD-10-CM | POA: Insufficient documentation

## 2019-03-20 NOTE — Progress Notes (Signed)
   Covid-19 Vaccination Clinic  Name:  Trevor Armstrong    MRN: [id number] DOB: 09/21/1950  03/20/2019  Trevor Armstrong was observed post Covid-19 immunization for 15 minutes without incidence. He was provided with Vaccine Information Sheet and instruction to access the V-Safe system.   Trevor Armstrong was instructed to call 911 with any severe reactions post vaccine: Marland Kitchen Difficulty breathing  . Swelling of your face and throat  . A fast heartbeat  . A bad rash all over your body  . Dizziness and weakness    Immunizations Administered    Name Date Dose VIS Date Route   Pfizer COVID-19 Vaccine 03/20/2019  9:41 AM 0.3 mL 01/02/2019 Intramuscular   Manufacturer: [employer], [employer]   Lot: [id number]   NDC: [address]-3

## 2019-04-14 ENCOUNTER — Ambulatory Visit: Payer: Medicare PPO | Attending: Internal Medicine

## 2019-04-14 DIAGNOSIS — Z23 Encounter for immunization: Secondary | ICD-10-CM

## 2019-04-14 NOTE — Progress Notes (Signed)
   Covid-19 Vaccination Clinic  Name:  Trevor Armstrong    MRN: [id number] DOB: Dec 12, 1950  04/14/2019  Trevor Armstrong was observed post Covid-19 immunization for 15 minutes without incident. He was provided with Vaccine Information Sheet and instruction to access the V-Safe system.   Trevor Armstrong was instructed to call 911 with any severe reactions post vaccine: Marland Kitchen Difficulty breathing  . Swelling of face and throat  . A fast heartbeat  . A bad rash all over body  . Dizziness and weakness   Immunizations Administered    Name Date Dose VIS Date Route   Pfizer COVID-19 Vaccine 04/14/2019  2:29 PM 0.3 mL 01/02/2019 Intramuscular   Manufacturer: [employer], [employer]   Lot: [id number]   NDC: [address]-9

## 2019-04-20 ENCOUNTER — Other Ambulatory Visit: Payer: Medicare PPO

## 2019-05-11 ENCOUNTER — Ambulatory Visit: Payer: Self-pay | Admitting: Urology

## 2019-05-25 ENCOUNTER — Other Ambulatory Visit: Payer: Medicare PPO

## 2019-05-25 ENCOUNTER — Other Ambulatory Visit: Payer: Self-pay

## 2019-05-25 ENCOUNTER — Ambulatory Visit
Admission: RE | Admit: 2019-05-25 | Discharge: 2019-05-25 | Disposition: A | Discharge status: Home / Self Care | Payer: Medicare PPO | Source: Ambulatory Visit | Attending: Urology | Admitting: Urology

## 2019-05-25 DIAGNOSIS — N200 Calculus of kidney: Secondary | ICD-10-CM

## 2019-06-04 ENCOUNTER — Telehealth: Payer: Self-pay | Admitting: Family Medicine

## 2019-06-04 NOTE — Telephone Encounter (Signed)
Patient notified a letter from [place] link was faxed letting us know that the kit was too old. Patient states he did the collection correctly and explained how he collected. He did do it correct. [employer] has sent another kit to the patient. He states he will stop the Vitamin C again and do the test again. He will call our office to schedule the lab.

## 2019-06-15 ENCOUNTER — Other Ambulatory Visit: Payer: Medicare PPO

## 2019-06-15 ENCOUNTER — Other Ambulatory Visit: Payer: Self-pay

## 2019-06-17 ENCOUNTER — Ambulatory Visit: Payer: Self-pay | Admitting: Urology

## 2019-06-23 ENCOUNTER — Other Ambulatory Visit: Payer: Self-pay | Admitting: Urology

## 2019-07-02 ENCOUNTER — Ambulatory Visit: Payer: Self-pay | Admitting: Urology

## 2019-07-20 ENCOUNTER — Other Ambulatory Visit: Payer: Self-pay

## 2019-07-20 ENCOUNTER — Encounter: Payer: Self-pay | Admitting: Urology

## 2019-07-20 ENCOUNTER — Ambulatory Visit (INDEPENDENT_AMBULATORY_CARE_PROVIDER_SITE_OTHER): Payer: Medicare PPO | Admitting: Urology

## 2019-07-20 VITALS — BP 130/80 | HR 65 | Ht 69.0 in | Wt 225.4 lb

## 2019-07-20 DIAGNOSIS — N200 Calculus of kidney: Secondary | ICD-10-CM

## 2019-07-20 NOTE — Progress Notes (Signed)
PATIENT ID: Trevor Armstrong, male     DOB: 05-29-1950, 69 y.o.     MRN: [id number]   ENCOUNTER: 07/20/19, 1:06 PM     REFERRING PROVIDER: Fernanda Drum, [place],  [place] [address]  Chief Complaint  Patient presents with   Nephrolithiasis    Urologic history: 1.  Nephrolithiasis -Admitted 12/31/2018 intractable renal colic secondary to 7 mm left proximal ureteral calculus -Ureteroscopic removal 01/01/2019 -Stent removed 12/17 -Nonobstructing right renal calculus  HPI: Trevor Armstrong is a 69 y.o. male presenting today for follow up for his nephrolthiasis.  Today he reports:  Decreased kidney function as reported by his PCP (creatinine 1.4)  U/S of his Aorta & Kidneys scheduled  Reports hx of Asprin use due to arthritic knee pain  Completed 24-hour urine study however the only results received for his blood work all unremarkable  KUB performed last month remarkable for a stable 4 mm right lower pole calculus  PMHx: Past Medical History:  Diagnosis Date   Kidney stone     SURGICAL HISTORY: Past Surgical History:  Procedure Laterality Date   CYSTOSCOPY/URETEROSCOPY/HOLMIUM LASER/STENT PLACEMENT Left 01/01/2019   Procedure: CYSTOSCOPY/URETEROSCOPY/HOLMIUM LASER/STENT PLACEMENT;  Surgeon: Abbie Sons, MD;  Location: [facility] ORS;  Service: Urology;  Laterality: Left;   EYE SURGERY      HOME MEDICATIONS:  Allergies as of 07/20/2019      Reactions   Sulfa Antibiotics       Medication List       Accurate as of July 20, 2019  1:06 PM. If you have any questions, ask your nurse or doctor.        atorvastatin 10 MG tablet Commonly known as: LIPITOR Take 10 mg by mouth daily.   NON FORMULARY Take 4.5 mg by mouth daily.   sildenafil 50 MG tablet Commonly known as: VIAGRA Take by mouth.   tamsulosin 0.4 MG Caps capsule Commonly known as: FLOMAX Take 1 capsule (0.4 mg total) by mouth daily after supper.   valACYclovir 1000  MG tablet Commonly known as: VALTREX Take 1,000 mg by mouth 2 (two) times daily.       ALLERGIES: Allergies  Allergen Reactions   Sulfa Antibiotics     FAMILY HISTORY: No family history on file.  SOCIAL HISTORY:  reports that he has never smoked. He has never used smokeless tobacco. He reports that he does not drink alcohol and does not use drugs.  PHYSICAL EXAM: BP 130/80 (BP Location: Left Arm, Patient Position: Sitting, Cuff Size: Large)    Pulse 65    Ht 5\' 9"  (1.753 m)    Wt 225 lb 6.4 oz (102.2 kg)    BMI 33.29 kg/m   Constitutional:  Alert and oriented, No acute distress. HEENT: [place] AT, moist mucus membranes.  Trachea midline, no masses. Cardiovascular: No clubbing, cyanosis, or edema. Respiratory: Normal respiratory effort, no increased work of breathing. Neurologic: Grossly intact, no focal deficits, moving all 4 extremities. Psychiatric: Normal mood and affect.   Pertinent Imaging: KUB personally reviewed  Abdomen 1 view (KUB)  Narrative CLINICAL DATA:  Nephrolithiasis.  EXAM: ABDOMEN - 1 VIEW  COMPARISON:  CT scan dated 12/31/2018  FINDINGS: There is a stable 4 mm stone in the lower pole of the right kidney as demonstrated on the prior CT scan. No visible stone along the course of the right ureter. Arterial vascular calcifications in the pelvis.  No visible left renal stone. Phleboliths in the left side of  the pelvis. The proximal left ureteral stone visible on the prior CT scan is no longer apparent.  The bowel gas pattern is normal. No acute bone abnormality.  IMPRESSION: 1. Stable 4 mm stone in the lower pole of the right kidney. 2. Otherwise benign appearing abdomen and pelvis. 3. The proximal left ureteral stone visible on the prior CT scan is no longer apparent.   Electronically Signed By: Francene Boyers M.D. On: 05/25/2019 15:21    ASSESSMENT/PLAN: 1.  Nephrolithiasis  Stable right lower pole calculus  Will contact [facility]  regarding his 24-hour urine results and he will be contacted  RTC 1 year with KUB   Riki Altes, MD  [facility] [address], [address] [place], [place] [address] [phone]  By signing my name below, I, [employer], attest that this documentation has been prepared under the direction and in the presence of Irineo Axon, MD. Electronically Signed: Doneta Public 07/20/19, 1:06 PM   I have reviewed the above documentation for accuracy and completeness, and I agree with the above.   Riki Altes, MD

## 2019-07-20 NOTE — Patient Instructions (Signed)
Dietary Guidelines to Help Prevent Kidney Stones Kidney stones are deposits of minerals and salts that form inside your kidneys. Your risk of developing kidney stones may be greater depending on your diet, your lifestyle, the medicines you take, and whether you have certain medical conditions. Most people can reduce their chances of developing kidney stones by following the instructions below. Depending on your overall health and the type of kidney stones you tend to develop, your dietitian may give you more specific instructions. What are tips for following this plan? Reading food labels  Choose foods with "no salt added" or "low-salt" labels. Limit your sodium intake to less than 1500 mg per day.  Choose foods with calcium for each meal and snack. Try to eat about 300 mg of calcium at each meal. Foods that contain 200-500 mg of calcium per serving include: ? 8 oz (237 ml) of milk, fortified nondairy milk, and fortified fruit juice. ? 8 oz (237 ml) of kefir, yogurt, and soy yogurt. ? 4 oz (118 ml) of tofu. ? 1 oz of cheese. ? 1 cup (300 g) of dried figs. ? 1 cup (91 g) of cooked broccoli. ? 1-3 oz can of sardines or mackerel.  Most people need 1000 to 1500 mg of calcium each day. Talk to your dietitian about how much calcium is recommended for you. Shopping  Buy plenty of fresh fruits and vegetables. Most people do not need to avoid fruits and vegetables, even if they contain nutrients that may contribute to kidney stones.  When shopping for convenience foods, choose: ? Whole pieces of fruit. ? Premade salads with dressing on the side. ? Low-fat fruit and yogurt smoothies.  Avoid buying frozen meals or prepared deli foods.  Look for foods with live cultures, such as yogurt and kefir. Cooking  Do not add salt to food when cooking. Place a salt shaker on the table and allow each person to add his or her own salt to taste.  Use vegetable protein, such as beans, textured vegetable  protein (TVP), or tofu instead of meat in pasta, casseroles, and soups. Meal planning   Eat less salt, if told by your dietitian. To do this: ? Avoid eating processed or premade food. ? Avoid eating fast food.  Eat less animal protein, including cheese, meat, poultry, or fish, if told by your dietitian. To do this: ? Limit the number of times you have meat, poultry, fish, or cheese each week. Eat a diet free of meat at least 2 days a week. ? Eat only one serving each day of meat, poultry, fish, or seafood. ? When you prepare animal protein, cut pieces into small portion sizes. For most meat and fish, one serving is about the size of one deck of cards.  Eat at least 5 servings of fresh fruits and vegetables each day. To do this: ? Keep fruits and vegetables on hand for snacks. ? Eat 1 piece of fruit or a handful of berries with breakfast. ? Have a salad and fruit at lunch. ? Have two kinds of vegetables at dinner.  Limit foods that are high in a substance called oxalate. These include: ? Spinach. ? Rhubarb. ? Beets. ? Potato chips and french fries. ? Nuts.  If you regularly take a diuretic medicine, make sure to eat at least 1-2 fruits or vegetables high in potassium each day. These include: ? Avocado. ? Banana. ? Orange, prune, carrot, or tomato juice. ? Baked potato. ? Cabbage. ? Beans and split   peas. General instructions   Drink enough fluid to keep your urine clear or pale yellow. This is the most important thing you can do.  Talk to your health care provider and dietitian about taking daily supplements. Depending on your health and the cause of your kidney stones, you may be advised: ? Not to take supplements with vitamin C. ? To take a calcium supplement. ? To take a daily probiotic supplement. ? To take other supplements such as magnesium, fish oil, or vitamin B6.  Take all medicines and supplements as told by your health care provider.  Limit alcohol intake to no  more than 1 drink a day for nonpregnant women and 2 drinks a day for men. One drink equals 12 oz of beer, 5 oz of wine, or 1 oz of hard liquor.  Lose weight if told by your health care provider. Work with your dietitian to find strategies and an eating plan that works best for you. What foods are not recommended? Limit your intake of the following foods, or as told by your dietitian. Talk to your dietitian about specific foods you should avoid based on the type of kidney stones and your overall health. Grains Breads. Bagels. Rolls. Baked goods. Salted crackers. Cereal. Pasta. Vegetables Spinach. Rhubarb. Beets. Canned vegetables. Pickles. Olives. Meats and other protein foods Nuts. Nut butters. Large portions of meat, poultry, or fish. Salted or cured meats. Deli meats. Hot dogs. Sausages. Dairy Cheese. Beverages Regular soft drinks. Regular vegetable juice. Seasonings and other foods Seasoning blends with salt. Salad dressings. Canned soups. Soy sauce. Ketchup. Barbecue sauce. Canned pasta sauce. Casseroles. Pizza. Lasagna. Frozen meals. Potato chips. French fries. Summary  You can reduce your risk of kidney stones by making changes to your diet.  The most important thing you can do is drink enough fluid. You should drink enough fluid to keep your urine clear or pale yellow.  Ask your health care provider or dietitian how much protein from animal sources you should eat each day, and also how much salt and calcium you should have each day. This information is not intended to replace advice given to you by your health care provider. Make sure you discuss any questions you have with your health care provider. Document Revised: 04/30/2018 Document Reviewed: 12/20/2015 Elsevier Patient Education  2020 [employer] Inc[employer]  

## 2019-07-21 ENCOUNTER — Other Ambulatory Visit: Payer: Self-pay | Admitting: Urology

## 2019-07-22 ENCOUNTER — Encounter: Payer: Self-pay | Admitting: Urology

## 2019-07-23 ENCOUNTER — Telehealth (INDEPENDENT_AMBULATORY_CARE_PROVIDER_SITE_OTHER): Payer: Medicare PPO | Admitting: Urology

## 2019-07-23 ENCOUNTER — Encounter: Payer: Self-pay | Admitting: Urology

## 2019-07-23 ENCOUNTER — Other Ambulatory Visit: Payer: Self-pay

## 2019-07-23 DIAGNOSIS — N200 Calculus of kidney: Secondary | ICD-10-CM

## 2019-07-23 NOTE — Progress Notes (Signed)
Virtual Visit via Telephone Note  I connected with Trevor Armstrong on 07/23/19 at  8:00 AM EDT by telephone and verified that I am speaking with the correct person using two identifiers.  Location: Patient: Home Provider: [facility]   I discussed the limitations, risks, security and privacy concerns of performing an evaluation and management service by telephone and the availability of in person appointments. I also discussed with the patient that there may be a patient responsible charge related to this service. The patient expressed understanding and agreed to proceed.   History of Present Illness: Trevor Armstrong was in earlier this week for 24 urine results however they were not available.  They were obtained from [facility].  Urine volume was low at 1.46 L; mild hypocitraturia and low urine pH with increased uric acid supersaturation.  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  We discussed these findings and I have recommended increasing his water intake to keep urine output 2-2.5 L/day.  I also recommended Litho[facility]te supplement 10 mEq twice daily  Follow Up Instructions: Follow-up as scheduled    Riki Altes, MD

## 2019-10-05 ENCOUNTER — Ambulatory Visit: Payer: Medicare PPO | Attending: Internal Medicine

## 2019-10-05 DIAGNOSIS — Z23 Encounter for immunization: Secondary | ICD-10-CM

## 2019-10-05 NOTE — Progress Notes (Signed)
   Covid-19 Vaccination Clinic  Name:  Trevor Armstrong    MRN: [id number] DOB: April 09, 1950  10/05/2019  Mr. Trevor Armstrong was observed post Covid-19 immunization for 15 minutes without incident. He was provided with Vaccine Information Sheet and instruction to access the V-Safe system.   Mr. Trevor Armstrong was instructed to call 911 with any severe reactions post vaccine: Marland Kitchen Difficulty breathing  . Swelling of face and throat  . A fast heartbeat  . A bad rash all over body  . Dizziness and weakness

## 2019-10-25 ENCOUNTER — Encounter: Payer: Self-pay | Admitting: Emergency Medicine

## 2019-10-25 ENCOUNTER — Other Ambulatory Visit: Payer: Self-pay

## 2019-10-25 ENCOUNTER — Emergency Department: Payer: Medicare PPO

## 2019-10-25 DIAGNOSIS — N201 Calculus of ureter: Secondary | ICD-10-CM | POA: Insufficient documentation

## 2019-10-25 DIAGNOSIS — K219 Gastro-esophageal reflux disease without esophagitis: Secondary | ICD-10-CM | POA: Diagnosis not present

## 2019-10-25 DIAGNOSIS — R109 Unspecified abdominal pain: Secondary | ICD-10-CM | POA: Diagnosis present

## 2019-10-25 LAB — URINALYSIS, COMPLETE (UACMP) WITH MICROSCOPIC
Bilirubin Urine: NEGATIVE
Glucose, UA: NEGATIVE mg/dL
Ketones, ur: NEGATIVE mg/dL
Leukocytes,Ua: NEGATIVE
Nitrite: NEGATIVE
Protein, ur: 30 mg/dL — AB
RBC / HPF: >50 RBC/hpf — ABNORMAL HIGH (ref 0–5)
Specific Gravity, Urine: 1.028 (ref 1.005–1.030)
pH: 5 (ref 5.0–8.0)

## 2019-10-25 LAB — CBC
HCT: 41.5 % (ref 39.0–52.0)
Hemoglobin: 13.9 g/dL (ref 13.0–17.0)
MCH: 30.8 pg (ref 26.0–34.0)
MCHC: 33.5 g/dL (ref 30.0–36.0)
MCV: 91.8 fL (ref 80.0–100.0)
Platelets: 199 10*3/uL (ref 150–400)
RBC: 4.52 MIL/uL (ref 4.22–5.81)
RDW: 13.3 % (ref 11.5–15.5)
WBC: 11.8 10*3/uL — ABNORMAL HIGH (ref 4.0–10.5)
nRBC: 0 % (ref 0.0–0.2)

## 2019-10-25 LAB — BASIC METABOLIC PANEL
Anion gap: 11 (ref 5–15)
BUN: 22 mg/dL (ref 8–23)
CO2: 25 mmol/L (ref 22–32)
Calcium: 9.3 mg/dL (ref 8.9–10.3)
Chloride: 105 mmol/L (ref 98–111)
Creatinine, Ser: 1.35 mg/dL — ABNORMAL HIGH (ref 0.61–1.24)
GFR calc Af Amer: >60 mL/min (ref >60)
GFR calc non Af Amer: 54 mL/min — ABNORMAL LOW (ref >60)
Glucose, Bld: 147 mg/dL — ABNORMAL HIGH (ref 70–99)
Potassium: 4.1 mmol/L (ref 3.5–5.1)
Sodium: 141 mmol/L (ref 135–145)

## 2019-10-25 MED ORDER — ACETAMINOPHEN 500 MG PO TABS
1000.0000 mg | ORAL_TABLET | Freq: Once | ORAL | Status: AC
  Administered 2019-10-25: 1000 mg via ORAL
  Filled 2019-10-25: qty 2

## 2019-10-25 MED ORDER — FENTANYL CITRATE (PF) 100 MCG/2ML IJ SOLN
50.0000 ug | INTRAMUSCULAR | Status: DC | PRN
  Administered 2019-10-25: 50 ug via NASAL
  Filled 2019-10-25: qty 2

## 2019-10-25 MED ORDER — KETOROLAC TROMETHAMINE 30 MG/ML IJ SOLN
30.0000 mg | Freq: Once | INTRAMUSCULAR | Status: AC
  Administered 2019-10-25: 30 mg via INTRAVENOUS
  Filled 2019-10-25: qty 1

## 2019-10-25 MED ORDER — ONDANSETRON HCL 4 MG PO TABS
4.0000 mg | ORAL_TABLET | Freq: Once | ORAL | Status: AC
  Administered 2019-10-25: 4 mg via ORAL
  Filled 2019-10-25: qty 1

## 2019-10-25 NOTE — ED Triage Notes (Signed)
Pt arrives POV and ambulatory to triage with c/o right sided flank pain. Pt reports that pain started around 1400-1500. Pt reports that pain is radiating around abdomen.

## 2019-10-26 ENCOUNTER — Emergency Department
Admission: EM | Admit: 2019-10-26 | Discharge: 2019-10-26 | Disposition: A | Discharge status: Home / Self Care | Payer: Medicare PPO | Attending: Emergency Medicine | Admitting: Emergency Medicine

## 2019-10-26 DIAGNOSIS — N200 Calculus of kidney: Secondary | ICD-10-CM

## 2019-10-26 DIAGNOSIS — N201 Calculus of ureter: Secondary | ICD-10-CM

## 2019-10-26 MED ORDER — OXYCODONE-ACETAMINOPHEN 5-325 MG PO TABS: 2.0000 | ORAL_TABLET | Freq: Four times a day (QID) | ORAL | 0 refills | Status: DC | PRN

## 2019-10-26 MED ORDER — KETOROLAC TROMETHAMINE 10 MG PO TABS: 10.0000 mg | ORAL_TABLET | Freq: Four times a day (QID) | ORAL | 0 refills | Status: AC | PRN

## 2019-10-26 MED ORDER — DOCUSATE SODIUM 100 MG PO CAPS: ORAL_CAPSULE | ORAL | 0 refills | Status: DC

## 2019-10-26 MED ORDER — OXYCODONE HCL 5 MG PO TABS
10.0000 mg | ORAL_TABLET | ORAL | Status: AC
  Administered 2019-10-26: 10 mg via ORAL
  Filled 2019-10-26: qty 2

## 2019-10-26 MED ORDER — TAMSULOSIN HCL 0.4 MG PO CAPS: ORAL_CAPSULE | ORAL | 0 refills | Status: DC

## 2019-10-26 NOTE — Discharge Instructions (Signed)
You have been seen in the Emergency Department (ED) today for pain caused by kidney stones.  As we have discussed, please drink plenty of fluids.  Please make a follow up appointment with the physician(s) listed elsewhere in this documentation.  You may take pain medication as needed but ONLY as prescribed.  Please also take your prescribed Flomax daily.   Please see your doctor as soon as possible.  They may instruct you regarding which medications you can or cannot take in case you are eligible for a procedure such as lithotripsy later in the week.  Please make sure to follow their recommendations.  Do not drink alcohol, drive or participate in any other potentially dangerous activities while taking opiate pain medication as it may make you sleepy. Do not take this medication with any other sedating medications, either prescription or over-the-counter. If you were prescribed Percocet or Vicodin, do not take these with acetaminophen (Tylenol) as it is already contained within these medications.   Take Percocet as needed for severe pain.  This medication is an opiate (or narcotic) pain medication and can be habit forming.  Use it as little as possible to achieve adequate pain control.  Do not use or use it with extreme caution if you have a history of opiate abuse or dependence.  If you are on a pain contract with your primary care doctor or a pain specialist, be sure to let them know you were prescribed this medication today from the [facility].  This medication is intended for your use only - do not give any to anyone else and keep it in a secure place where nobody else, especially children, have access to it.  It will also cause or worsen constipation, so you may want to consider taking an over-the-counter stool softener while you are taking this medication.  Return to the Emergency Department (ED) or call your doctor if you have any worsening pain, fever, painful urination,  are unable to urinate, or develop other symptoms that concern you.

## 2019-10-26 NOTE — ED Provider Notes (Signed)
[facility] Emergency Department Provider Note  ____________________________________________   First MD Initiated Contact with Patient 10/26/19 0250     (approximate)  I have reviewed the triage vital signs and the nursing notes.   HISTORY  Chief Complaint Flank Pain    HPI Trevor Armstrong is a 69 y.o. male who has had a kidney stone in the past, about 10 months ago, that required a ureteral stent and laser lithotripsy by Dr. Lonna CobbStoioff.  He presents tonight via private vehicle for acute onset and severe right-sided flank pain radiating into his abdomen accompanied with nausea and vomiting.  The pain was sharp and severe and felt similar to the prior episode of kidney stone but it was on the opposite side.  Nothing in particular made it better or worse initially and he received some intranasal fentanyl.  He had great success in the past with Toradol so he requested Toradol and was given a dose of Toradol 30 mg IV approximately 5 hours ago and it completely removed the pain.  He said that he can feel a little bit of discomfort on the right side but it is essentially completely resolved and has been since he got the medication.  He denies fever/chills, sore throat, chest pain, shortness of breath, and any residual nausea, vomiting, nor abdominal pain.  He has had no difficulty urinating and no recent burning or pain when he urinates.  He is not on any blood thinners.  He is fully vaccinated against COVID-19.         Past Medical History:  Diagnosis Date  . Kidney stone     Patient Active Problem List   Diagnosis Date Noted  . Nephrolithiasis 02/11/2019  . Anxiety and depression 01/08/2019  . Diverticulitis 01/08/2019  . GERD (gastroesophageal reflux disease) 01/08/2019  . History of colonic polyps 01/08/2019  . Psoriasis 01/08/2019  . Hyperlipidemia 01/01/2019  . AKI (acute kidney injury) (HCC) 01/01/2019  . Obesity (BMI 35.0-39.9 without comorbidity)  11/01/2016  . Primary osteoarthritis of right knee 11/01/2016  . 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 Arthritis of knee, right 01/14/2012  . Knee pain 01/14/2012    Past Surgical History:  Procedure Laterality Date  . CYSTOSCOPY/URETEROSCOPY/HOLMIUM LASER/STENT PLACEMENT Left 01/01/2019   Procedure: CYSTOSCOPY/URETEROSCOPY/HOLMIUM LASER/STENT PLACEMENT;  Surgeon: Riki AltesStoioff, Scott C, MD;  Location: [facility] ORS;  Service: Urology;  Laterality: Left;  . EYE SURGERY      Prior to Admission medications   Medication Sig Start Date End Date Taking? Authorizing Provider  atorvastatin (LIPITOR) 10 MG tablet Take 10 mg by mouth daily. 12/14/18   [provider]  docusate sodium (COLACE) 100 MG capsule Take 1 tablet once or twice daily as needed for constipation while taking narcotic pain medicine 10/26/19   Loleta RoseForbach, Theodoros Stjames, MD  ketorolac (TORADOL) 10 MG tablet Take 1 tablet (10 mg total) by mouth every 6 (six) hours as needed for up to 5 days for moderate pain or severe pain. 10/26/19 10/31/19  Loleta RoseForbach, Ziyon Cedotal, MD  NON FORMULARY Take 4.5 mg by mouth daily.    [provider]  oxyCODONE-acetaminophen (PERCOCET) 5-325 MG tablet Take 2 tablets by mouth every 6 (six) hours as needed for severe pain. 10/26/19   Loleta RoseForbach, Jessicamarie Amiri, MD  sildenafil (VIAGRA) 50 MG tablet Take by mouth. 06/30/19   [provider]  tamsulosin (FLOMAX) 0.4 MG CAPS capsule Take 1 tablet by mouth daily until you pass the kidney stone or no longer have symptoms 10/26/19  Loleta Rose, MD  valACYclovir (VALTREX) 1000 MG tablet Take 1,000 mg by mouth 2 (two) times daily.  12/26/18   [provider]    Allergies Sulfa antibiotics  No family history on file.  Social History Social History   Tobacco Use  . Smoking status: Never Smoker  . Smokeless tobacco: Never Used  Substance Use Topics  . Alcohol use: Never  . Drug use: Never    Review of Systems Constitutional: No  fever/chills Eyes: No visual changes. ENT: No sore throat. Cardiovascular: Denies chest pain. Respiratory: Denies shortness of breath. Gastrointestinal: Severe right-sided flank pain radiating to the abdomen with nausea and vomiting, now resolved. Genitourinary: Negative for dysuria. Musculoskeletal: Severe right-sided flank pain as described above. Integumentary: Negative for rash. Neurological: Negative for headaches, focal weakness or numbness.   ____________________________________________   PHYSICAL EXAM:  VITAL SIGNS: ED Triage Vitals  Enc Vitals Group     BP 10/25/19 1854 (!) 167/79     Pulse Rate 10/25/19 1854 67     Resp 10/25/19 1854 18     Temp 10/25/19 1854 97.7 F (36.5 C)     Temp Source 10/25/19 1854 Oral     SpO2 10/25/19 1854 99 %     Weight 10/25/19 1906 98.9 kg (218 lb)     Height 10/25/19 1906 1.753 m (5\' 9" )     Head Circumference --      Peak Flow --      Pain Score 10/25/19 1906 10     Pain Loc --      Pain Edu? --      Excl. in GC? --     Constitutional: Alert and oriented.  Eyes: Conjunctivae are normal.  Head: Atraumatic. Nose: No congestion/rhinnorhea. Mouth/Throat: Patient is wearing a mask. Neck: No stridor.  No meningeal signs.   Cardiovascular: Normal rate, regular rhythm. Good peripheral circulation. Grossly normal heart sounds. Respiratory: Normal respiratory effort.  No retractions. Gastrointestinal: Soft and nontender. No distention.  Musculoskeletal: No lower extremity tenderness nor edema. No gross deformities of extremities. Neurologic:  Normal speech and language. No gross focal neurologic deficits are appreciated.  Skin:  Skin is warm, dry and intact. Psychiatric: Mood and affect are normal. Speech and behavior are normal.  ____________________________________________   LABS (all labs ordered are listed, but only abnormal results are displayed)  Labs Reviewed  URINALYSIS, COMPLETE (UACMP) WITH MICROSCOPIC - Abnormal;  Notable for the following components:      Result Value   Color, Urine YELLOW (*)    APPearance HAZY (*)    Hgb urine dipstick LARGE (*)    Protein, ur 30 (*)    RBC / HPF >50 (*)    Bacteria, UA RARE (*)    All other components within normal limits  CBC - Abnormal; Notable for the following components:   WBC 11.8 (*)    All other components within normal limits  BASIC METABOLIC PANEL - Abnormal; Notable for the following components:   Glucose, Bld 147 (*)    Creatinine, Ser 1.35 (*)    GFR calc non Af Amer 54 (*)    All other components within normal limits   ____________________________________________  EKG  ED ECG REPORT I, 12/25/19, the attending physician, personally viewed and interpreted this ECG.  Date: 10/25/2019 EKG Time: 18: 58 Rate: 80 Rhythm: Sinus rhythm with frequent PVCs QRS Axis: normal Intervals: normal ST/T Wave abnormalities: Non-specific ST segment / T-wave changes, but no clear evidence of acute ischemia.  Narrative Interpretation: no definitive evidence of acute ischemia; does not meet STEMI criteria.   ____________________________________________  RADIOLOGY I, Loleta Rose, personally viewed and evaluated these images (plain radiographs) as part of my medical decision making, as well as reviewing the written report by the radiologist.  ED MD interpretation: 4 mm proximal right-sided ureteral calculus.  Some mildly dilated bowel loops are present but nonspecific.  Official radiology report(s): CT Renal Stone Study  Result Date: 10/25/2019 CLINICAL DATA:  Right-sided flank pain EXAM: CT ABDOMEN AND PELVIS WITHOUT CONTRAST TECHNIQUE: Multidetector CT imaging of the abdomen and pelvis was performed following the standard protocol without IV contrast. COMPARISON:  December 31, 2018 FINDINGS: Lower chest: The visualized heart size within normal limits. No pericardial fluid/thickening. No hiatal hernia. The visualized portions of the lungs are clear.  Hepatobiliary: Although limited due to the lack of intravenous contrast, normal in appearance without gross focal abnormality. No evidence of calcified gallstones or biliary ductal dilatation. Pancreas:  Unremarkable.  No surrounding inflammatory changes. Spleen: Normal in size. Although limited due to the lack of intravenous contrast, normal in appearance. Adrenals/Urinary Tract: Both adrenal glands appear normal. For there is mild right-sided pelviectasis with a proximal right ureteral calculus measuring 5 mm. Mild right-sided perinephric stranding is seen. Again noted is a 2 cm low-density lesion in the lower pole of the right kidney. No left-sided renal or collecting system calculi are noted. The bladder is grossly unremarkable. Stomach/Bowel: The stomach, is unremarkable. No dilated loops of proximal small bowel are noted. Within the distal ileum there is mildly prominent loops within the deep pelvis measuring up to 3 cm with fecalization of the ileum. There is scattered colonic diverticula present with a moderate amount of colonic stool. Vascular/Lymphatic: There are no enlarged abdominal or pelvic lymph nodes. Scattered aortic atherosclerotic calcifications are seen without aneurysmal dilatation. Reproductive: The prostate is unremarkable. Other: Small fat containing inguinal hernias are noted. Musculoskeletal: No acute or significant osseous findings. IMPRESSION: Mild right pelviectasis with a proximal 4 mm ureteral calculus. Mildly dilated distal ileal loops with fecalization which could be due to slow transit. Diverticulosis without diverticulitis. Aortic Atherosclerosis (ICD10-I70.0). Electronically Signed   By: Jonna Clark M.D.   On: 10/25/2019 19:56    ____________________________________________   PROCEDURES   Procedure(s) performed (including Critical Care):  Procedures   ____________________________________________   INITIAL IMPRESSION / MDM / ASSESSMENT AND PLAN / ED COURSE  As  part of my medical decision making, I reviewed the following data within the electronic MEDICAL RECORD NUMBER Nursing notes reviewed and incorporated, Labs reviewed , EKG interpreted , Old chart reviewed, Notes from prior ED visits and [place] Controlled Substance Database   Differential diagnosis includes, but is not limited to, renal/ureteral colic, obstructive stone with infection, UTI, pyelonephritis.  The patient is very well-appearing and in no distress, reporting no pain and has no abdominal tenderness to palpation on exam.  He said his pain completely resolved after getting Toradol about 5 hours ago.  It sounds like he had a considerably larger stone the last time when he required a stent and surgery, but this time his stent is about 4 mm and he is in no pain or distress whatsoever.  He has hematuria but no evidence of infection.  Lab results are reassuring with a white blood cell count of 11.8 and an essentially normal metabolic panel other than a creatinine of 1.35 which is actually better than his prior creatinines on record which are typically around 1.5-1.6.  I  explained to the patient that his stone should pass on its own but I will send a message through [facility] to Dr. Lonna Cobb and Dr. Apolinar Junes to try to facilitate him getting in to see someone in [facility] urological at the next available opportunity.  He may be a candidate for outpatient lithotripsy.  I am giving him my usual prescriptions as documented below with the notable exception that he has very strong positive feelings about Toradol so I gave him a prescription for oral Toradol rather than ibuprofen.  I encouraged him to call the urology office in the morning if they do not reach out to him.  I gave my usual and customary return precautions and he understands and agrees with the plan.  Of note, there was a nonspecific finding of some mildly dilated bowel loops on the CT scan but he has no tenderness to palpation of the abdomen and no signs or  symptoms to suggest SBO/ileus.           ____________________________________________  FINAL CLINICAL IMPRESSION(S) / ED DIAGNOSES  Final diagnoses:  Right ureteral stone  Kidney stone     MEDICATIONS GIVEN DURING THIS VISIT:  Medications  oxyCODONE (Oxy IR/ROXICODONE) immediate release tablet 10 mg (has no administration in time range)  ondansetron (ZOFRAN) tablet 4 mg (4 mg Oral Given 10/25/19 1916)  acetaminophen (TYLENOL) tablet 1,000 mg (1,000 mg Oral Given 10/25/19 2226)  ketorolac (TORADOL) 30 MG/ML injection 30 mg (30 mg Intravenous Given 10/25/19 2228)     ED Discharge Orders         Ordered    oxyCODONE-acetaminophen (PERCOCET) 5-325 MG tablet  Every 6 hours PRN        10/26/19 0339    ketorolac (TORADOL) 10 MG tablet  Every 6 hours PRN        10/26/19 0339    tamsulosin (FLOMAX) 0.4 MG CAPS capsule        10/26/19 0339    docusate sodium (COLACE) 100 MG capsule        10/26/19 0339          *Please note:  Esley Brooking was evaluated in Emergency Department on 10/26/2019 for the symptoms described in the history of present illness. He was evaluated in the context of the global COVID-19 pandemic, which necessitated consideration that the patient might be at risk for infection with the SARS-CoV-2 virus that causes COVID-19. Institutional protocols and algorithms that pertain to the evaluation of patients at risk for COVID-19 are in a state of rapid change based on information released by regulatory bodies including the CDC and federal and state organizations. These policies and algorithms were followed during the patient's care in the ED.  Some ED evaluations and interventions may be delayed as a result of limited staffing during and after the pandemic.*  Note:  This document was prepared using Dragon voice recognition software and may include unintentional dictation errors.   Loleta Rose, MD 10/26/19 [phone]

## 2019-10-27 ENCOUNTER — Encounter: Payer: Self-pay | Admitting: Physician Assistant

## 2019-10-27 ENCOUNTER — Ambulatory Visit
Admission: RE | Admit: 2019-10-27 | Discharge: 2019-10-27 | Disposition: A | Discharge status: Home / Self Care | Payer: Medicare PPO | Attending: Physician Assistant | Admitting: Physician Assistant

## 2019-10-27 ENCOUNTER — Ambulatory Visit
Admission: RE | Admit: 2019-10-27 | Discharge: 2019-10-27 | Disposition: A | Discharge status: Home / Self Care | Payer: Medicare PPO | Source: Ambulatory Visit | Attending: Physician Assistant | Admitting: Physician Assistant

## 2019-10-27 ENCOUNTER — Ambulatory Visit (INDEPENDENT_AMBULATORY_CARE_PROVIDER_SITE_OTHER): Payer: Medicare PPO | Admitting: Physician Assistant

## 2019-10-27 ENCOUNTER — Other Ambulatory Visit: Payer: Self-pay | Admitting: Physician Assistant

## 2019-10-27 ENCOUNTER — Other Ambulatory Visit: Payer: Self-pay

## 2019-10-27 VITALS — BP 152/74 | HR 78 | Ht 69.0 in | Wt 218.0 lb

## 2019-10-27 DIAGNOSIS — N201 Calculus of ureter: Secondary | ICD-10-CM | POA: Insufficient documentation

## 2019-10-27 DIAGNOSIS — N200 Calculus of kidney: Secondary | ICD-10-CM | POA: Diagnosis not present

## 2019-10-27 LAB — MICROSCOPIC EXAMINATION
Bacteria, UA: NONE SEEN
Epithelial Cells (non renal): NONE SEEN /hpf (ref 0–10)

## 2019-10-27 LAB — URINALYSIS, COMPLETE
Bilirubin, UA: NEGATIVE
Glucose, UA: NEGATIVE
Ketones, UA: NEGATIVE
Leukocytes,UA: NEGATIVE
Nitrite, UA: NEGATIVE
Protein,UA: NEGATIVE
Specific Gravity, UA: 1.02 (ref 1.005–1.030)
Urobilinogen, Ur: 0.2 mg/dL (ref 0.2–1.0)
pH, UA: 5.5 (ref 5.0–7.5)

## 2019-10-27 MED ORDER — ONDANSETRON HCL 4 MG PO TABS: 4.0000 mg | ORAL_TABLET | Freq: Three times a day (TID) | ORAL | 0 refills | Status: AC | PRN

## 2019-10-27 NOTE — Progress Notes (Signed)
10/27/2019 11:40 AM   Trevor Armstrong 01/30/1950 [id number]  CC: Chief Complaint  Patient presents with  . Nephrolithiasis    HPI: Trevor Armstrong is a 69 y.o. male with PMH nephrolithiasis who presents today for ED follow-up of an acute stone episode.  He was seen in the ED 2 days ago for evaluation of acute onset right flank pain and nausea.  CT stone study revealed a 4 mm proximal right ureteral stone.  Pain was well managed with Toradol and he was discharged home with Flomax and pain medication.  Today he reports resolution of his flank pain.  He has not required narcotic pain medication since leaving the emergency department.  He has started Flomax.  He does report some slight dysuria.  KUB today reveals interval migration of the 4 mm stone to his UVJ versus bladder.  In-office UA today positive for 1+ blood; urine microscopy with 3-10 RBCs/HPF.  PMH: Past Medical History:  Diagnosis Date  . Kidney stone     Surgical History: Past Surgical History:  Procedure Laterality Date  . CYSTOSCOPY/URETEROSCOPY/HOLMIUM LASER/STENT PLACEMENT Left 01/01/2019   Procedure: CYSTOSCOPY/URETEROSCOPY/HOLMIUM LASER/STENT PLACEMENT;  Surgeon: Abbie Sons, MD;  Location: [facility] ORS;  Service: Urology;  Laterality: Left;  . EYE SURGERY      Home Medications:  Allergies as of 10/27/2019      Reactions   Sulfa Antibiotics       Medication List       Accurate as of October 27, 2019 11:40 AM. If you have any questions, ask your nurse or doctor.        atorvastatin 10 MG tablet Commonly known as: LIPITOR Take 10 mg by mouth daily.   docusate sodium 100 MG capsule Commonly known as: Colace Take 1 tablet once or twice daily as needed for constipation while taking narcotic pain medicine   ketorolac 10 MG tablet Commonly known as: TORADOL Take 1 tablet (10 mg total) by mouth every 6 (six) hours as needed for up to 5 days for moderate pain or severe pain.   NON FORMULARY Take 4.5  mg by mouth daily.   ondansetron 4 MG tablet Commonly known as: Zofran Take 1 tablet (4 mg total) by mouth every 8 (eight) hours as needed for up to 14 days. Started by: Debroah Loop, PA-C   oxyCODONE-acetaminophen 5-325 MG tablet Commonly known as: Percocet Take 2 tablets by mouth every 6 (six) hours as needed for severe pain.   sildenafil 50 MG tablet Commonly known as: VIAGRA Take by mouth.   tamsulosin 0.4 MG Caps capsule Commonly known as: FLOMAX Take 1 tablet by mouth daily until you pass the kidney stone or no longer have symptoms   valACYclovir 1000 MG tablet Commonly known as: VALTREX Take 1,000 mg by mouth 2 (two) times daily.       Allergies:  Allergies  Allergen Reactions  . Sulfa Antibiotics     Family History: History reviewed. No pertinent family history.  Social History:   reports that he has never smoked. He has never used smokeless tobacco. He reports that he does not drink alcohol and does not use drugs.  Physical Exam: BP (!) 152/74   Pulse 78   Ht _0  (1.753 m)   Wt 218 lb (98.9 kg)   BMI 32.19 kg/m   Constitutional:  Alert and oriented, no acute distress, nontoxic appearing HEENT: , AT Cardiovascular: No clubbing, cyanosis, or edema Respiratory: Normal respiratory effort, no increased work of breathing Skin:  No rashes, bruises or suspicious lesions Neurologic: Grossly intact, no focal deficits, moving all 4 extremities Psychiatric: Normal mood and affect  Laboratory Data: Results for orders placed or performed in visit on 10/27/19  Microscopic Examination   Urine  Result Value Ref Range   WBC, UA 0-5 0 - 5 /hpf   RBC 3-10 (A) 0 - 2 /hpf   Epithelial Cells (non renal) None seen 0 - 10 /hpf   Bacteria, UA None seen None seen/Few  Urinalysis, Complete  Result Value Ref Range   Specific Gravity, UA 1.020 1.005 - 1.030   pH, UA 5.5 5.0 - 7.5   Color, UA Yellow Yellow   Appearance Ur Clear Clear   Leukocytes,UA Negative  Negative   Protein,UA Negative Negative/Trace   Glucose, UA Negative Negative   Ketones, UA Negative Negative   RBC, UA 1+ (A) Negative   Bilirubin, UA Negative Negative   Urobilinogen, Ur 0.2 0.2 - 1.0 mg/dL   Nitrite, UA Negative Negative   Microscopic Examination See below:    Pertinent Imaging: KUB, 10/27/2019: CLINICAL DATA:  69 year old male with right ureteral calculus on CT Abdomen and Pelvis 2 days ago. Right flank pain.  EXAM: ABDOMEN - 1 VIEW  COMPARISON:  CT Abdomen and Pelvis 10/25/2019.  FINDINGS: Supine views. The 4 mm right ureteral calculus which was at the L4-L5 level on the recent CT cannot be identified radiographically. Stable left hemipelvis phlebolith. Stable scoliosis. Non obstructed bowel gas pattern. Negative visible lung bases.  IMPRESSION: Four mm right ureteral calculus demonstrated on CT two days ago is not identified radiographically.   Electronically Signed   By: Genevie Ann M.D.   On: 10/27/2019 16:43  I personally reviewed the images referenced above and note interval migration of the 4 mm right ureteral stone to the right UVJ versus bladder.  Assessment & Plan:   1. Nephrolithiasis Patient reports resolution of right flank pain and nausea.  UA today with microscopic hematuria consistent with acute stone episode but otherwise benign.  KUB with significant interval migration of the stone.  I do lengthy conversation with the patient today.  I explained that given his absence of pain and distal migration of the stone, I recommend deferring definitive stone management in favor of continued trial of passage.  I explained that with a 4 mm stone, he is likely to be able to pass the stone on his own with MET and not require further intervention.  He is in agreement with this plan.  We will prescribe Zofran in case of return of nausea.  Counseled him to continue Flomax and provided a strainer today.  We will plan for follow-up in 3 weeks.  If he  has not passed the stone by then, may consider ESWL versus URS.  Discussed return precautions today including uncontrollable pain, nausea, vomiting, or new fever or chills.  He expressed understanding. - Urinalysis, Complete - ondansetron (ZOFRAN) 4 MG tablet; Take 1 tablet (4 mg total) by mouth every 8 (eight) hours as needed for up to 14 days.  Dispense: 10 tablet; Refill: 0  Return in about 3 weeks (around 11/17/2019) for Stone f/u with UA + KUB prior.  Debroah Loop, PA-C  [facility] [address] [place], [place] [address] [phone]

## 2019-10-28 ENCOUNTER — Other Ambulatory Visit: Payer: Self-pay | Admitting: *Deleted

## 2019-10-28 DIAGNOSIS — N200 Calculus of kidney: Secondary | ICD-10-CM

## 2019-11-02 LAB — CALCULI, WITH PHOTOGRAPH (CLINICAL LAB)
Calcium Oxalate Dihydrate: 20 %
Calcium Oxalate Monohydrate: 80 %
Weight Calculi: 54 mg

## 2019-11-17 ENCOUNTER — Ambulatory Visit
Admission: RE | Admit: 2019-11-17 | Discharge: 2019-11-17 | Disposition: A | Discharge status: Home / Self Care | Payer: Medicare PPO | Source: Ambulatory Visit | Attending: Urology | Admitting: Urology

## 2019-11-17 ENCOUNTER — Other Ambulatory Visit: Payer: Self-pay

## 2019-11-17 ENCOUNTER — Ambulatory Visit (INDEPENDENT_AMBULATORY_CARE_PROVIDER_SITE_OTHER): Payer: Medicare PPO | Admitting: Physician Assistant

## 2019-11-17 ENCOUNTER — Ambulatory Visit
Admission: RE | Admit: 2019-11-17 | Discharge: 2019-11-17 | Disposition: A | Discharge status: Home / Self Care | Payer: Medicare PPO | Attending: Urology | Admitting: Urology

## 2019-11-17 VITALS — BP 126/77 | HR 52 | Ht 69.0 in | Wt 218.0 lb

## 2019-11-17 DIAGNOSIS — N200 Calculus of kidney: Secondary | ICD-10-CM

## 2019-11-17 DIAGNOSIS — R3129 Other microscopic hematuria: Secondary | ICD-10-CM

## 2019-11-17 NOTE — Progress Notes (Signed)
11/17/2019 3:47 PM   Trevor Armstrong 1950-05-17 [id number]  CC: Chief Complaint  Patient presents with  . Nephrolithiasis    HPI: Trevor Armstrong is a 69 y.o. male PMH nephrolithiasis with a recent acute stone episode associated with a 4 mm right ureteral stone who presents today for stone follow-up.  I saw him in clinic most recently on 10/27/2019, at which point he noted dysuria and resolution of right flank pain.  KUB showed distal migration of the stone into the UVJ versus bladder and he had microscopic hematuria on UA.  He subsequently passed the stone and brought it to clinic for analysis.  Stone analysis with 80% calcium oxalate monohydrate, 20% calcium oxalate dihydrate.  Today he reports feeling well.  His pain and dysuria have resolved.  No acute concerns today.  KUB today with interval clearance of the distal right ureteral versus bladder stone.  Per CT stone study dated 10/25/2019, he is not expected to have any residual nephrolithiasis.  In-office UA and microscopy today pan negative.  PMH: Past Medical History:  Diagnosis Date  . Kidney stone     Surgical History: Past Surgical History:  Procedure Laterality Date  . CYSTOSCOPY/URETEROSCOPY/HOLMIUM LASER/STENT PLACEMENT Left 01/01/2019   Procedure: CYSTOSCOPY/URETEROSCOPY/HOLMIUM LASER/STENT PLACEMENT;  Surgeon: Riki Altes, MD;  Location: [facility] ORS;  Service: Urology;  Laterality: Left;  . EYE SURGERY      Home Medications:  Allergies as of 11/17/2019      Reactions   Sulfa Antibiotics       Medication List       Accurate as of November 17, 2019  3:47 PM. If you have any questions, ask your nurse or doctor.        atorvastatin 10 MG tablet Commonly known as: LIPITOR Take 10 mg by mouth daily.   docusate sodium 100 MG capsule Commonly known as: Colace Take 1 tablet once or twice daily as needed for constipation while taking narcotic pain medicine   NON FORMULARY Take 4.5 mg by mouth daily.    oxyCODONE-acetaminophen 5-325 MG tablet Commonly known as: Percocet Take 2 tablets by mouth every 6 (six) hours as needed for severe pain.   sildenafil 50 MG tablet Commonly known as: VIAGRA Take by mouth.   tamsulosin 0.4 MG Caps capsule Commonly known as: FLOMAX Take 1 tablet by mouth daily until you pass the kidney stone or no longer have symptoms   valACYclovir 1000 MG tablet Commonly known as: VALTREX Take 1,000 mg by mouth 2 (two) times daily.       Allergies:  Allergies  Allergen Reactions  . Sulfa Antibiotics     Family History: No family history on file.  Social History:   reports that he has never smoked. He has never used smokeless tobacco. He reports that he does not drink alcohol and does not use drugs.  Physical Exam: BP 126/77   Pulse (!) 52   Ht 5\' 9"  (1.753 m)   Wt 218 lb (98.9 kg)   BMI 32.19 kg/m   Constitutional:  Alert and oriented, no acute distress, nontoxic appearing HEENT: [place], AT Cardiovascular: No clubbing, cyanosis, or edema Respiratory: Normal respiratory effort, no increased work of breathing Skin: No rashes, bruises or suspicious lesions Neurologic: Grossly intact, no focal deficits, moving all 4 extremities Psychiatric: Normal mood and affect  Laboratory Data: Results for orders placed or performed in visit on 11/17/19  Microscopic Examination   Urine  Result Value Ref Range   WBC, UA 0-5  0 - 5 /hpf   RBC None seen 0 - 2 /hpf   Epithelial Cells (non renal) 0-10 0 - 10 /hpf   Bacteria, UA None seen None seen/Few  Urinalysis, Complete  Result Value Ref Range   Specific Gravity, UA 1.015 1.005 - 1.030   pH, UA 5.5 5.0 - 7.5   Color, UA Yellow Yellow   Appearance Ur Clear Clear   Leukocytes,UA Negative Negative   Protein,UA Negative Negative/Trace   Glucose, UA Negative Negative   Ketones, UA Negative Negative   RBC, UA Negative Negative   Bilirubin, UA Negative Negative   Urobilinogen, Ur 0.2 0.2 - 1.0 mg/dL   Nitrite,  UA Negative Negative   Microscopic Examination See below:    Pertinent Imaging: KUB, 11/17/2019: CLINICAL DATA:  Renal calculi  EXAM: ABDOMEN - 1 VIEW  COMPARISON:  10/27/2019  FINDINGS: Two supine frontal views of the abdomen and pelvis are obtained. The hemidiaphragms are excluded by collimation. No evidence of urinary tract calculi. No masses or abnormal calcifications. Bowel gas pattern is unremarkable. Stable multilevel thoracolumbar spondylosis and left convex lumbar scoliosis.  IMPRESSION: 1. No evidence of urinary tract calculi.   Electronically Signed   By: Sharlet Salina M.D.   On: 11/20/2019 08:48  I personally reviewed the images referenced above and note interval clearance of the distal right ureteral stone.  Assessment & Plan:   1. Nephrolithiasis Patient has passed his right ureteral stone with negative UA and KUB today.  We discussed stone prevention recommendations for calcium oxalate stones today, including increasing p.o. hydration, decreasing dietary oxalate, maintaining moderate calcium intake, decreasing sodium intake, and increasing citrate.  He expressed understanding.  Patient has no residual nephrolithiasis at this point.  Okay to follow-up as needed. - Urinalysis, Complete  2. Microscopic hematuria Resolved today, likely secondary to #1 above.  No further intervention indicated.  Return if symptoms worsen or fail to improve.  Carman Ching, PA-C  [facility] [address] [place], [place] [address] [phone]

## 2019-11-18 LAB — URINALYSIS, COMPLETE
Bilirubin, UA: NEGATIVE
Glucose, UA: NEGATIVE
Ketones, UA: NEGATIVE
Leukocytes,UA: NEGATIVE
Nitrite, UA: NEGATIVE
Protein,UA: NEGATIVE
RBC, UA: NEGATIVE
Specific Gravity, UA: 1.015 (ref 1.005–1.030)
Urobilinogen, Ur: 0.2 mg/dL (ref 0.2–1.0)
pH, UA: 5.5 (ref 5.0–7.5)

## 2019-11-18 LAB — MICROSCOPIC EXAMINATION
Bacteria, UA: NONE SEEN
RBC, Urine: NONE SEEN /hpf (ref 0–2)

## 2020-01-29 ENCOUNTER — Other Ambulatory Visit: Payer: Medicare PPO

## 2020-01-29 DIAGNOSIS — Z20822 Contact with and (suspected) exposure to covid-19: Secondary | ICD-10-CM

## 2020-02-01 LAB — NOVEL CORONAVIRUS, NAA: SARS-CoV-2, NAA: NOT DETECTED

## 2020-07-19 ENCOUNTER — Other Ambulatory Visit: Payer: Self-pay | Admitting: *Deleted

## 2020-07-19 DIAGNOSIS — N200 Calculus of kidney: Secondary | ICD-10-CM

## 2020-07-20 ENCOUNTER — Ambulatory Visit: Payer: Medicare PPO | Admitting: Urology

## 2020-07-20 ENCOUNTER — Ambulatory Visit
Admission: RE | Admit: 2020-07-20 | Discharge: 2020-07-20 | Disposition: A | Discharge status: Home / Self Care | Payer: Medicare PPO | Source: Ambulatory Visit | Attending: Urology | Admitting: Urology

## 2020-07-20 ENCOUNTER — Other Ambulatory Visit: Payer: Self-pay

## 2020-07-20 ENCOUNTER — Encounter: Payer: Self-pay | Admitting: Urology

## 2020-07-20 VITALS — BP 142/86 | HR 72 | Ht 69.0 in | Wt 220.0 lb

## 2020-07-20 DIAGNOSIS — Z87442 Personal history of urinary calculi: Secondary | ICD-10-CM

## 2020-07-20 DIAGNOSIS — N200 Calculus of kidney: Secondary | ICD-10-CM

## 2020-07-20 NOTE — Progress Notes (Signed)
   07/20/2020 1:08 PM   Trevor Armstrong Jul 19, 1950 [id number]  Referring provider: Wilkie Aye, MD [address] [place],  [place] [address]  Chief Complaint  Patient presents with   Nephrolithiasis     Urologic history: 1.  Nephrolithiasis -Ureteroscopic removal 01/01/2019 -Nonobstructing right renal calculus -Metabolic evaluation low urine volume 1.46 L; mild hypocitraturia and low urine pH -Stone analysis mixed calcium oxalate   HPI: 70 y.o. male presents for annual follow-up.  Seen in ED 10/26/2019 with right renal colic and previously noted right renal calculus had migrated the proximal ureter Trevor Armstrong in follow-up and subsequently passed his stone Presently asymptomatic   PMH: Past Medical History:  Diagnosis Date   Kidney stone     Surgical History: Past Surgical History:  Procedure Laterality Date   CYSTOSCOPY/URETEROSCOPY/HOLMIUM LASER/STENT PLACEMENT Left 01/01/2019   Procedure: CYSTOSCOPY/URETEROSCOPY/HOLMIUM LASER/STENT PLACEMENT;  Surgeon: Trevor Altes, MD;  Location: [facility] ORS;  Service: Urology;  Laterality: Left;   EYE SURGERY      Home Medications:  Allergies as of 07/20/2020       Reactions   Sulfa Antibiotics         Medication List        Accurate as of July 20, 2020  1:08 PM. If you have any questions, ask your nurse or doctor.          atorvastatin 10 MG tablet Commonly known as: LIPITOR Take 10 mg by mouth daily.   docusate sodium 100 MG capsule Commonly known as: Colace Take 1 tablet once or twice daily as needed for constipation while taking narcotic pain medicine   NON FORMULARY Take 4.5 mg by mouth daily.   oxyCODONE-acetaminophen 5-325 MG tablet Commonly known as: Percocet Take 2 tablets by mouth every 6 (six) hours as needed for severe pain.   sildenafil 50 MG tablet Commonly known as: VIAGRA Take by mouth.   tamsulosin 0.4 MG Caps capsule Commonly known as: FLOMAX Take 1 tablet  by mouth daily until you pass the kidney stone or no longer have symptoms   valACYclovir 1000 MG tablet Commonly known as: VALTREX Take 1,000 mg by mouth 2 (two) times daily.        Allergies:  Allergies  Allergen Reactions   Sulfa Antibiotics     Family History: No family history on file.  Social History:  reports that he has never smoked. He has never used smokeless tobacco. He reports that he does not drink alcohol and does not use drugs.   Physical Exam: BP (!) 142/86   Pulse 72   Ht 5\' 9"  (1.753 m)   Wt 220 lb (99.8 kg)   BMI 32.49 kg/m   Constitutional:  Alert and oriented, No acute distress. HEENT: [place] AT, moist mucus membranes.  Trachea midline, no masses. Cardiovascular: No clubbing, cyanosis, or edema. Respiratory: Normal respiratory effort, no increased work of breathing.   Pertinent Imaging: KUB performed today personally reviewed and there are no calcifications suspicious for urinary tract stones identified over the renal outlines over the expected course of the ureter   Assessment & Plan:    1.  History urinary calculi Recently passed the right renal calculus which was being followed Continue stone prevention measures Follow-up 2 years with KUB   , MD  [facility] [address], [address] [place], [place] [phone]

## 2020-07-30 IMAGING — CR DG ABDOMEN 1V
2 series · 2 of 2 positions shown · non-contrast
Comparison: CT scan dated 12/31/2018

CLINICAL DATA: Nephrolithiasis.

EXAM:
ABDOMEN - 1 VIEW

[abdomen kub (1 of 2)]
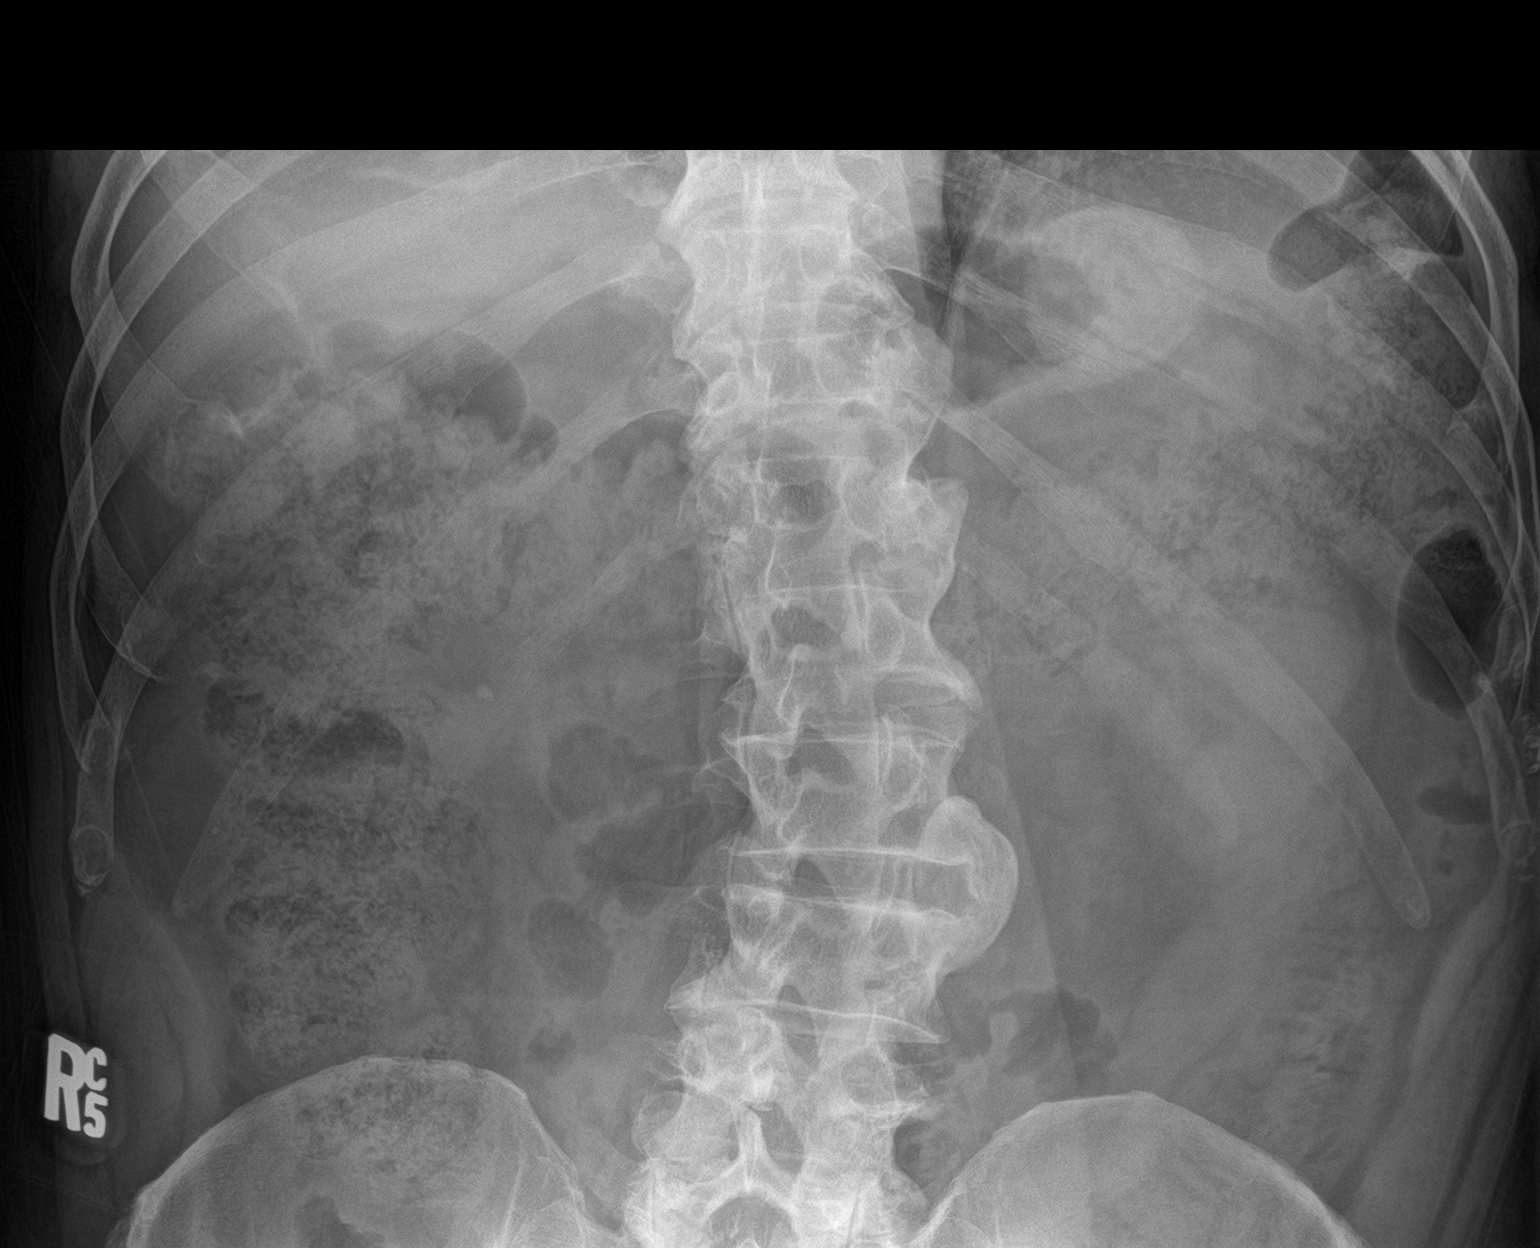

[abdomen kub (2 of 2)]
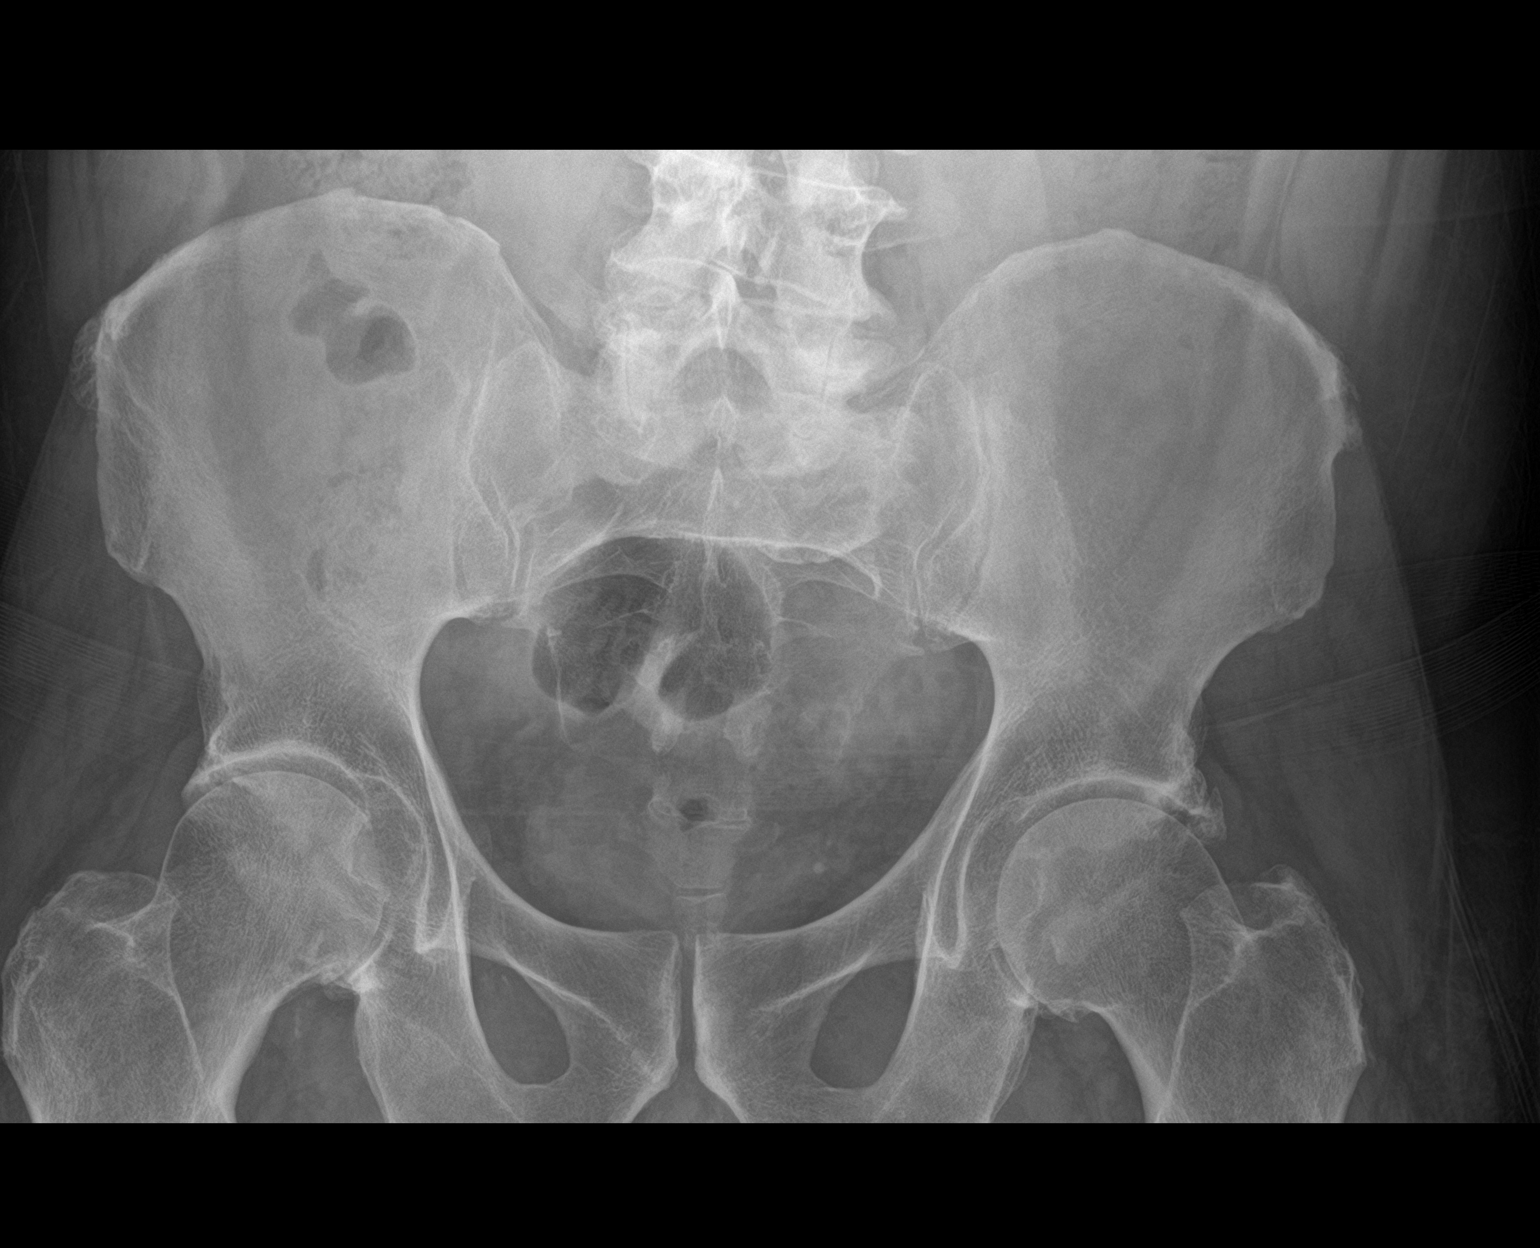

[2 of 2 positions shown; findings below may reference images not displayed]

FINDINGS: There is a stable 4 mm stone in the lower pole of the right kidney
as demonstrated on the prior CT scan. No visible stone along the
course of the right ureter. Arterial vascular calcifications in the
pelvis.

No visible left renal stone. Phleboliths in the left side of the
pelvis. The proximal left ureteral stone visible on the prior CT
scan is no longer apparent.

The bowel gas pattern is normal. No acute bone abnormality.
IMPRESSION: 1. Stable 4 mm stone in the lower pole of the right kidney.
2. Otherwise benign appearing abdomen and pelvis.
3. The proximal left ureteral stone visible on the prior CT scan is
no longer apparent.

## 2021-01-22 IMAGING — CR DG ABDOMEN 1V
1 series · 2 of 2 positions shown · non-contrast
Comparison: 10/27/2019

CLINICAL DATA: Renal calculi

EXAM:
ABDOMEN - 1 VIEW

[Series 1: dg abd 1 view · 0.14mm/px · 2 of 2 slices shown]
[im 1/2]
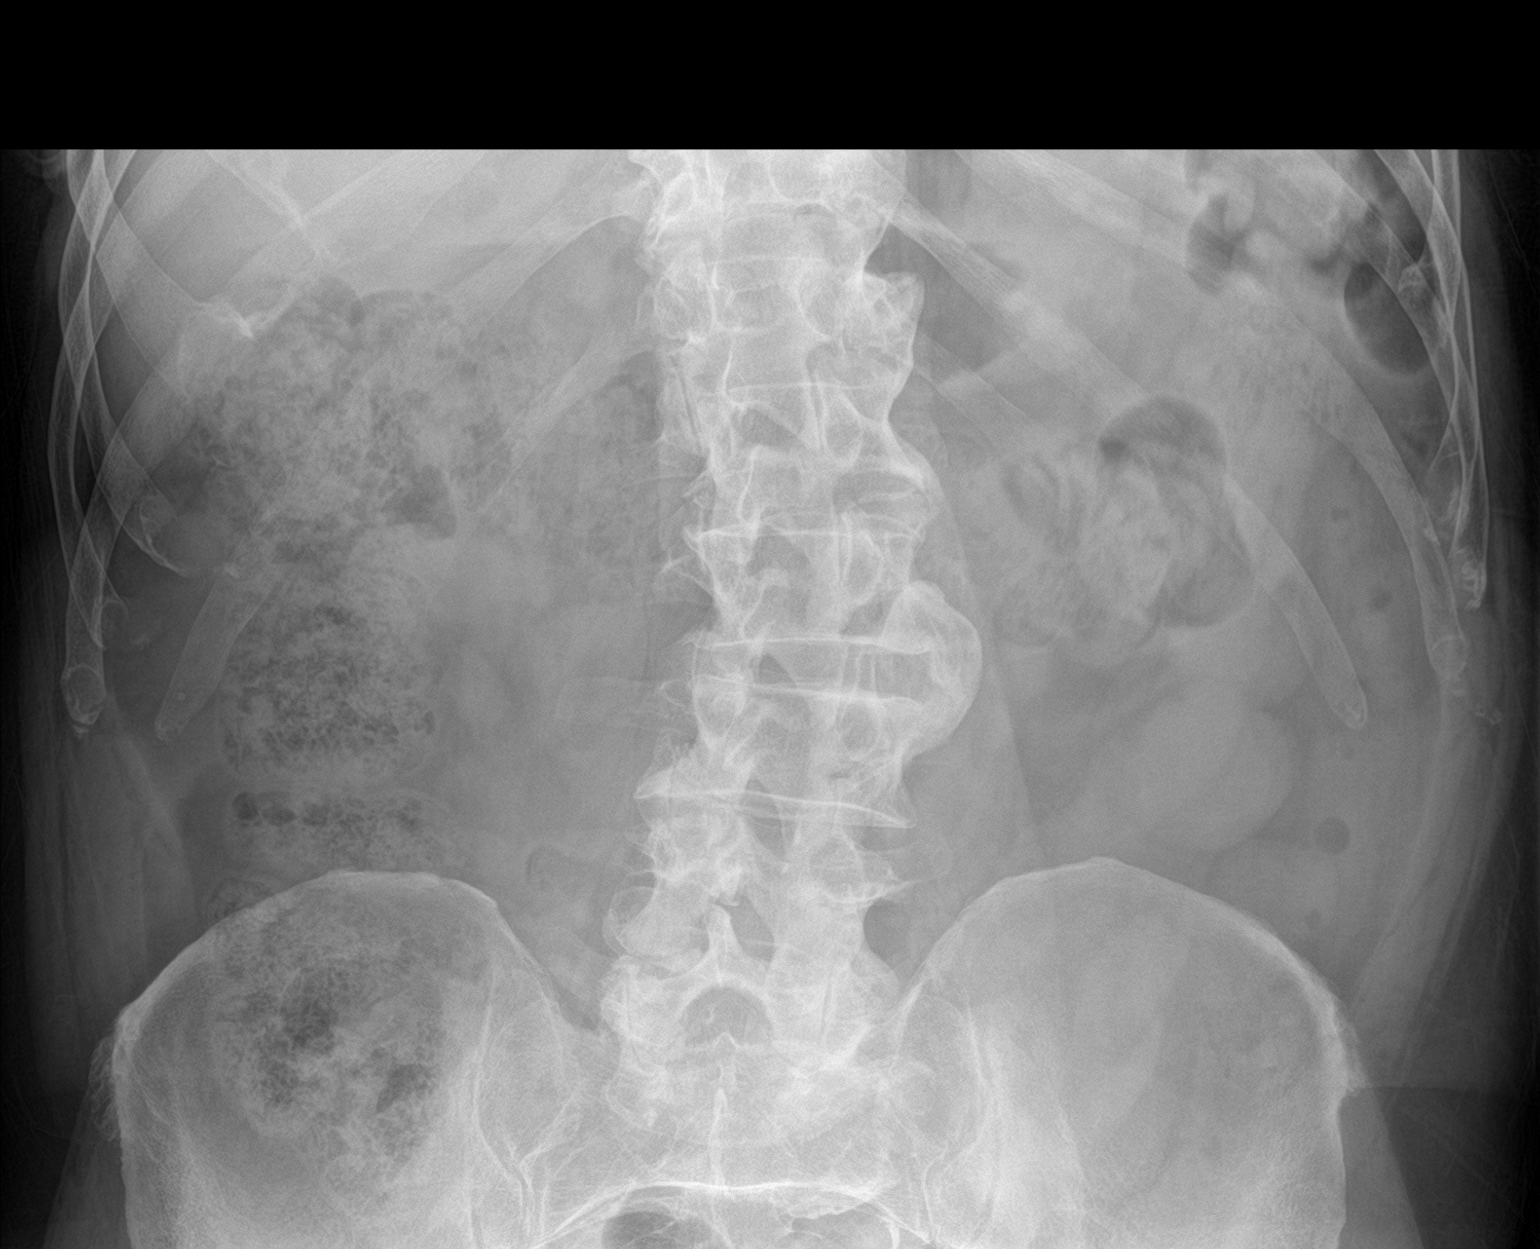
[im 2/2]
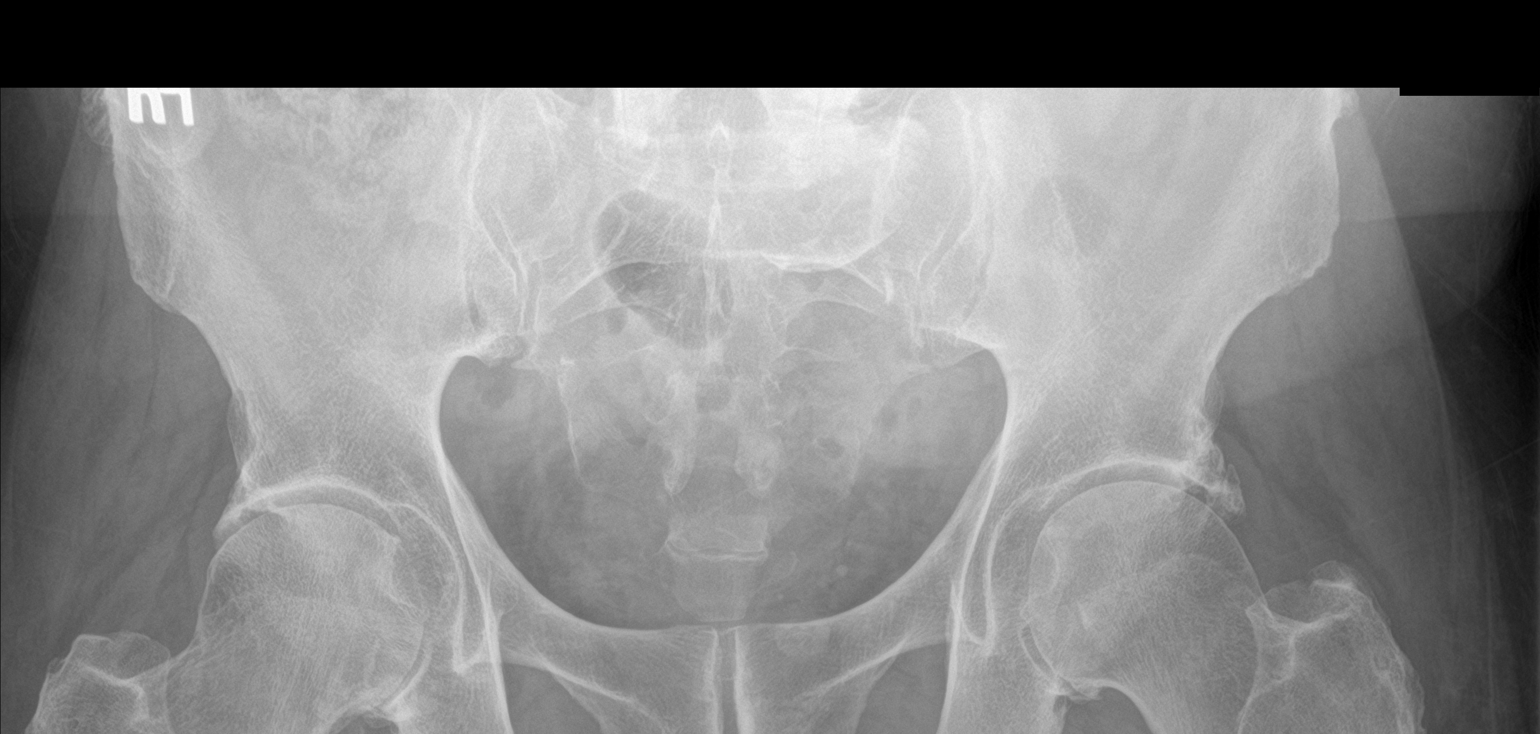

[2 of 2 positions shown; findings below may reference images not displayed]

FINDINGS: Two supine frontal views of the abdomen and pelvis are obtained. The
hemidiaphragms are excluded by collimation. No evidence of urinary
tract calculi. No masses or abnormal calcifications. Bowel gas
pattern is unremarkable. Stable multilevel thoracolumbar spondylosis
and left convex lumbar scoliosis.
IMPRESSION: 1. No evidence of urinary tract calculi.

## 2021-09-25 ENCOUNTER — Ambulatory Visit
Admission: RE | Admit: 2021-09-25 | Discharge: 2021-09-25 | Disposition: A | Discharge status: Home / Self Care | Payer: Medicare PPO | Source: Ambulatory Visit

## 2021-09-25 VITALS — BP 150/66 | HR 34 | Temp 97.7°F | Ht 69.0 in | Wt 220.0 lb

## 2021-09-25 DIAGNOSIS — J069 Acute upper respiratory infection, unspecified: Secondary | ICD-10-CM | POA: Diagnosis not present

## 2021-09-25 DIAGNOSIS — I493 Ventricular premature depolarization: Secondary | ICD-10-CM | POA: Diagnosis not present

## 2021-09-25 NOTE — ED Triage Notes (Signed)
Pt here with C/O nasal congestion and some fatigue for 5-6 days.

## 2021-09-25 NOTE — ED Provider Notes (Signed)
[facility]    CSN: [id number] Arrival date & time: 09/25/21  1755      History   Chief Complaint Chief Complaint  Patient presents with   Nasal Congestion    HPI Trevor Armstrong is a 71 y.o. male.   HPI  71 year old male here for evaluation of congestion and fatigue.  Patient reports that for the last 5 to 6 days he has had some nasal congestion with some clear nasal discharge, feeling more fatigued than normal, and some occasional wheezing.  He denies any fever, ear pain, sore throat, cough, or GI complaints.  He took a home COVID test which was negative.  He states that he typically exercises 3-4 times a week but his mother mom recently passed away so his exercise routine has been cut short.  I was called to the room because the patient's palpated pulse ox was in the mid 30s.  An EKG was obtained that shows a ventricular rate of 82 bpm with frequent PVCs.  Patient denies any syncope, near syncope, dizziness, or visual changes.  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  He did have a stress test in 2017 per his report but he does not currently see cardiology.  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 Authorizing Provider  atorvastatin (LIPITOR) 20 MG tablet Take 20 mg by mouth daily. 08/09/21  Yes [provider]  NON FORMULARY Take 4.5 mg by mouth daily.   Yes [provider]  valACYclovir (VALTREX) 1000 MG tablet Take 1,000 mg by mouth 2 (two) times daily.  12/26/18  Yes [provider]  atorvastatin (LIPITOR) 10 MG tablet Take 10 mg by mouth daily. 12/14/18   [provider]  docusate sodium (COLACE) 100 MG capsule Take 1 tablet once or twice daily as needed for constipation while taking narcotic pain medicine 10/26/19   Loleta Rose, MD  oxyCODONE-acetaminophen (PERCOCET) 5-325 MG tablet Take 2 tablets by mouth every 6 (six) hours as needed for severe pain. 10/26/19   Loleta Rose, MD  tamsulosin (FLOMAX) 0.4 MG CAPS capsule Take 1 tablet by mouth daily until you pass the kidney stone or no longer have symptoms 10/26/19   Loleta Rose, MD    Family History No family history on file.  Social History Social History   Tobacco Use   Smoking status: Never   Smokeless tobacco: Never  Substance Use Topics  Alcohol use: Never   Drug use: Never     Allergies   Sulfa antibiotics   Review of Systems Review of Systems  Constitutional:  Positive for fatigue.  HENT:  Positive for congestion and rhinorrhea. Negative for ear pain and sore throat.   Respiratory:  Negative for cough, shortness of breath and wheezing.   Cardiovascular:  Negative for chest pain and palpitations.  Neurological:  Negative for dizziness and syncope.  Hematological: Negative.   Psychiatric/Behavioral: Negative.       Physical Exam Triage Vital Signs ED  Triage Vitals  Enc Vitals Group     BP 09/25/21 1805 (!) 150/66     Pulse --      Resp --      Temp 09/25/21 1805 97.7 F (36.5 C)     Temp Source 09/25/21 1805 Oral     SpO2 09/25/21 1805 98 %     Weight 09/25/21 1802 220 lb (99.8 kg)     Height 09/25/21 1802 5\' 9"  (1.753 m)     Head Circumference --      Peak Flow --      Pain Score --      Pain Loc --      Pain Edu? --      Excl. in GC? --    No data found.  Updated Vital Signs BP (!) 150/66 (BP Location: Left Arm)   Pulse (!) 34   Temp 97.7 F (36.5 C) (Oral)   Ht 5\' 9"  (1.753 m)   Wt 220 lb (99.8 kg)   SpO2 98%   BMI 32.49 kg/m   Visual Acuity Right Eye Distance:   Left Eye Distance:   Bilateral Distance:    Right Eye Near:   Left Eye Near:    Bilateral Near:     Physical Exam Vitals and nursing note reviewed.  Constitutional:      Appearance: Normal appearance. He is not ill-appearing.  HENT:     Head: Normocephalic and atraumatic.     Right Ear: Tympanic membrane, ear canal and external ear normal. There is no impacted cerumen.     Left Ear: Tympanic membrane, ear canal and external ear normal. There is no impacted cerumen.     Nose: Nose normal.     Mouth/Throat:     Mouth: Mucous membranes are moist.     Pharynx: Oropharynx is clear. No oropharyngeal exudate or posterior oropharyngeal erythema.  Cardiovascular:     Rate and Rhythm: Normal rate and regular rhythm.     Pulses: Normal pulses.     Heart sounds: Normal heart sounds. No murmur heard.    No friction rub. No gallop.     Comments: Patient is frequent PVCs. Pulmonary:     Effort: Pulmonary effort is normal.     Breath sounds: Normal breath sounds. No wheezing, rhonchi or rales.  Musculoskeletal:     Cervical back: Normal range of motion and neck supple.  Lymphadenopathy:     Cervical: No cervical adenopathy.  Skin:    General: Skin is warm and dry.     Capillary Refill: Capillary refill takes less than 2 seconds.     Findings: No  erythema or rash.  Neurological:     General: No focal deficit present.     Mental Status: He is alert and oriented to person, place, and time.  Psychiatric:        Mood and Affect: Mood normal.        Behavior:  Behavior normal.        Thought Content: Thought content normal.        Judgment: Judgment normal.      UC Treatments / Results  Labs (all labs ordered are listed, but only abnormal results are displayed) Labs Reviewed - No data to display  EKG EKG shows sinus rhythm with frequent PVCs and possible PACs.  Ventricular rate of 82 bpm PR interval 154 ms QRS duration 92 ms QT/QTc 398/464 ms No ST or T wave abnormalities noted.   Radiology No results found.  Procedures Procedures (including critical care time)  Medications Ordered in UC Medications - No data to display  Initial Impression / Assessment and Plan / UC Course  I have reviewed the triage vital signs and the nursing notes.  Pertinent labs & imaging results that were available during my care of the patient were reviewed by me and considered in my medical decision making (see chart for details).   Patient is a nontoxic-appearing 71 year old male here for evaluation of respiratory symptoms and fatigue has been on for last 5 to 6 days.  I was called to the patient's room because the patient's palpable pulse was 32 and an EKG was obtained.  The EKG shows a ventricular rate of 82 bpm with sinus rhythm and frequent PVCs and possible PACs.  I compared the EKG to an EKG from 10/26/2019 which also showed frequent PVCs at that time.  I discussed with the patient his dyspnea on exertion and he states that this only when he goes up and down stairs and he attributes it to having bad knees.  He states that he does an hour on a reclined bike several times a week and he does not have any dyspnea.  He also denies any dizziness, presyncope, or syncope.  He had a primary care visit at the beginning of June and he denied any of those  symptoms his report at that time.  He just concerned about the fatigue and he thinks is because he has a cold.  He did have of blood work done at his last physical which did not show any signs of anemia but he did have an elevated creatinine on his chemistry of 1.4.  He does have history of elevated creatinine but a normal BUN going back to January 2023.  Patient's physical exam reveals pearly-gray tympanic membranes bilaterally with normal light reflex and clear external auditory canals.  His nasal mucosa is mildly erythematous and edematous with scant clear discharge in both nares.  Oropharyngeal exam reveals clear postnasal drip without any erythema or injection of the posterior oropharynx.  No cervical lymphadenopathy appreciable exam.  Cardiopulmonary exam reveals regular heart rate with frequent PVCs.  No murmurs, gallops, or rubs.  Lung sounds are clear to auscultation all fields.  I advised patient that he needs a follow-up with his PCP regarding his PVCs.  Patient reports that he had a stress test performed in 2017 but I do not find a record of that in epic.  I have advised him to rest, push fluids, talk to his PCP about his irregular heartbeat, and to seek care in the ER if he develops any dizziness, chest pain, fainting, or near fainting.  I also offered him draw blood work to look for anemia or electrolyte imbalance and patient declined after discussing that his electrolytes and CBC were normal in June.   Final Clinical Impressions(s) / UC Diagnoses   Final diagnoses:  Viral upper respiratory tract  infection     Discharge Instructions      Your exam is consistent with a viral upper respiratory infection.  Rest as much as possible.  Increase your oral fluid intake to improve your hydration and keep your mucous thin.  Talk to your PCP about your low heart rate noted here in the urgent care,  If you develop and dizziness, chest pain, fainting or near fainting you need to go to the ER.       ED Prescriptions   None    PDMP not reviewed this encounter.   Becky Augusta, NP 09/25/21 [phone]

## 2021-09-25 NOTE — Discharge Instructions (Signed)
Your exam is consistent with a viral upper respiratory infection.  Rest as much as possible.  Increase your oral fluid intake to improve your hydration and keep your mucous thin.  Talk to your PCP about your low heart rate noted here in the urgent care,  If you develop and dizziness, chest pain, fainting or near fainting you need to go to the ER.

## 2022-07-05 ENCOUNTER — Other Ambulatory Visit: Payer: Self-pay | Admitting: *Deleted

## 2022-07-05 DIAGNOSIS — N200 Calculus of kidney: Secondary | ICD-10-CM

## 2022-07-06 ENCOUNTER — Encounter: Payer: Self-pay | Admitting: Urology

## 2022-07-06 ENCOUNTER — Ambulatory Visit
Admission: RE | Admit: 2022-07-06 | Discharge: 2022-07-06 | Disposition: A | Discharge status: Home / Self Care | Payer: Medicare PPO | Source: Ambulatory Visit | Attending: Urology | Admitting: Urology

## 2022-07-06 ENCOUNTER — Ambulatory Visit: Payer: Medicare PPO | Admitting: Urology

## 2022-07-06 VITALS — BP 128/70 | HR 74 | Ht 69.0 in | Wt 203.0 lb

## 2022-07-06 DIAGNOSIS — N200 Calculus of kidney: Secondary | ICD-10-CM | POA: Diagnosis present

## 2022-07-06 DIAGNOSIS — N529 Male erectile dysfunction, unspecified: Secondary | ICD-10-CM | POA: Diagnosis not present

## 2022-07-06 DIAGNOSIS — Z87442 Personal history of urinary calculi: Secondary | ICD-10-CM

## 2022-07-06 MED ORDER — TADALAFIL 20 MG PO TABS: 20.0000 mg | ORAL_TABLET | Freq: Every day | ORAL | 0 refills | Status: DC | PRN

## 2022-07-06 NOTE — Progress Notes (Signed)
I, Maysun L Gibbs,acting as a scribe for Riki Altes, MD.,have documented all relevant documentation on the behalf of Riki Altes, MD,as directed by  Riki Altes, MD while in the presence of Riki Altes, MD.  07/06/2022 12:26 PM   Trevor Armstrong 25-Apr-1950 [id number]  Referring provider: Wilkie Aye, MD [address] [place],  [place] [address]  Chief Complaint  Patient presents with   Nephrolithiasis   Urologic history: 1.  Nephrolithiasis Ureteroscopic removal 01/01/2019 Nonobstructing right renal calculus Metabolic evaluation low urine volume 1.46 L; mild hypocitraturia and low urine pH Stone analysis mixed calcium oxalate  HPI: Trevor Armstrong is a 72 y.o. male presents for annual follow-up.  Last seen June 2022 and at that visit, elected to go to Q2 year follow-up with KUB. In the last 2 years not recurrent stone symptoms.  He did want to discuss ED today. He has had some difficulty achieving and maintaining an erection.  His PCP had ordered a PDE5 inhibitor. He thinks it was Sildenafil, but was not 100 mg. Was unable to find a record of this in the chart.  No oral or sublingual nitrates.    PMH: Past Medical History:  Diagnosis Date   Kidney stone     Surgical History: Past Surgical History:  Procedure Laterality Date   CYSTOSCOPY/URETEROSCOPY/HOLMIUM LASER/STENT PLACEMENT Left 01/01/2019   Procedure: CYSTOSCOPY/URETEROSCOPY/HOLMIUM LASER/STENT PLACEMENT;  Surgeon: Riki Altes, MD;  Location: [facility] OR[facility];  Service: Urology;  Laterality: Left;   EYE SURGERY      Home Medications:  Allergies as of 07/06/2022       Reactions   Sulfa Antibiotics         Medication List        Accurate as of July 06, 2022 12:26 PM. If you have any questions, ask your nurse or doctor.          STOP taking these medications    oxyCODONE-acetaminophen 5-325 MG tablet Commonly known as: Percocet Stopped by: Riki Altes, MD        TAKE these medications    atorvastatin 10 MG tablet Commonly known as: LIPITOR Take 10 mg by mouth daily.   atorvastatin 20 MG tablet Commonly known as: LIPITOR Take 20 mg by mouth daily.   docusate sodium 100 MG capsule Commonly known as: Colace Take 1 tablet once or twice daily as needed for constipation while taking narcotic pain medicine   NON FORMULARY Take 4.5 mg by mouth daily.   tadalafil 20 MG tablet Commonly known as: CIALIS Take 1 tablet (20 mg total) by mouth daily as needed for erectile dysfunction. Started by: Riki Altes, MD   tamsulosin 0.4 MG Caps capsule Commonly known as: FLOMAX Take 1 tablet by mouth daily until you pass the kidney stone or no longer have symptoms   valACYclovir 1000 MG tablet Commonly known as: VALTREX Take 1,000 mg by mouth 2 (two) times daily.        Allergies:  Allergies  Allergen Reactions   Sulfa Antibiotics     Social History:  reports that he has never smoked. He has never used smokeless tobacco. He reports that he does not drink alcohol and does not use drugs.   Physical Exam: BP 128/70   Pulse 74   Ht 5\' 9"  (1.753 m)   Wt 203 lb (92.1 kg)   BMI 29.98 kg/m   Constitutional:  Alert and oriented, No acute distress. HEENT: [place] AT, moist  mucus membranes.  Trachea midline, no masses. Cardiovascular: No clubbing, cyanosis, or edema. Respiratory: Normal respiratory effort, no increased work of breathing. GI: Abdomen is soft, nontender, nondistended, no abdominal masses Skin: No rashes, bruises or suspicious lesions. Neurologic: Grossly intact, no focal deficits, moving all 4 extremities. Psychiatric: Normal mood and affect.   Pertinent Imaging: KUB performed today was personally reviewed and interpreted. I do not see calcifications suspicious for recurrent urinary tract stones.    Assessment & Plan:    1. Personal history urinary calculi KUB today negative  2. Erectile dysfunction Has previously failed  Sildenafil but dose is unknown I did offer him a trial of Tadalafil 20 mg We discussed second-line options of intracavernosal injections and vacuum erection devices.  For recurrent stone/KUB can follow up as needed. If he does continue with a PDE5 inhibitor prescribed by me, would need to see annually, though he could also follow up with his PCP if they took over prescribing a PDE5 inhibitor.  [facility] [address], [address] [place], [place] [address] [phone]

## 2023-07-10 ENCOUNTER — Ambulatory Visit: Payer: Self-pay | Admitting: Urology

## 2023-07-19 ENCOUNTER — Encounter: Payer: Self-pay | Admitting: Urology

## 2023-07-19 ENCOUNTER — Ambulatory Visit: Admitting: Urology

## 2023-07-19 VITALS — BP 95/61 | HR 78 | Ht 69.0 in | Wt 205.0 lb

## 2023-07-19 DIAGNOSIS — Z87442 Personal history of urinary calculi: Secondary | ICD-10-CM

## 2023-07-19 DIAGNOSIS — Z09 Encounter for follow-up examination after completed treatment for conditions other than malignant neoplasm: Secondary | ICD-10-CM

## 2023-07-19 NOTE — Progress Notes (Unsigned)
    07/19/2023 12:51 PM   Trevor Armstrong 1950-04-29 [id number]  Referring provider: Lenny Redell Dawn, MD No address on file  Chief Complaint  Patient presents with   Other   Urologic history: 1.  Nephrolithiasis Ureteroscopic removal 01/01/2019 Nonobstructing right renal calculus Metabolic evaluation low urine volume 1.46 L; mild hypocitraturia and low urine pH Stone analysis mixed calcium  oxalate  HPI: Trevor Armstrong is a 73 y.o. male presents for annual follow-up.  No problems since last year's visit Denies flank, abdominal or pelvic pain No dysuria or gross hematuria No bothersome LUTS At last year's visit he was complaining of ED.  Sildenafil had not been effective and he desired a trial of tadalafil .  He states tadalafil  was not effective however since last year's visit he has lost a significant amount of weight and states that ED is no longer a problem PSA 07/17/2022 was 2.41   PMH: Past Medical History:  Diagnosis Date   Kidney stone     Surgical History: Past Surgical History:  Procedure Laterality Date   CYSTOSCOPY/URETEROSCOPY/HOLMIUM LASER/STENT PLACEMENT Left 01/01/2019   Procedure: CYSTOSCOPY/URETEROSCOPY/HOLMIUM LASER/STENT PLACEMENT;  Surgeon: Twylla Glendia BROCKS, MD;  Location: [facility];  Service: Urology;  Laterality: Left;   EYE SURGERY      Home Medications:  Allergies as of 07/19/2023       Reactions   Sulfa Antibiotics         Medication List        Accurate as of July 19, 2023 12:51 PM. If you have any questions, ask your nurse or doctor.          STOP taking these medications    docusate sodium  100 MG capsule Commonly known as: Colace Stopped by: Glendia BROCKS Twylla   tadalafil  20 MG tablet Commonly known as: CIALIS  Stopped by: Glendia BROCKS Twylla   tamsulosin  0.4 MG Caps capsule Commonly known as: FLOMAX  Stopped by: Glendia BROCKS Twylla       TAKE these medications    atorvastatin  20 MG tablet Commonly known as:  LIPITOR Take 20 mg by mouth daily. What changed: Another medication with the same name was removed. Continue taking this medication, and follow the directions you see here. Changed by: Glendia BROCKS Twylla   NON FORMULARY Take 4.5 mg by mouth daily.   valACYclovir 1000 MG tablet Commonly known as: VALTREX Take 1,000 mg by mouth 2 (two) times daily.        Allergies:  Allergies  Allergen Reactions   Sulfa Antibiotics     Social History:  reports that he has never smoked. He has never used smokeless tobacco. He reports that he does not drink alcohol and does not use drugs.   Physical Exam: BP 95/61   Pulse 78   Ht 5' 9 (1.753 m)   Wt 205 lb (93 kg)   BMI 30.27 kg/m   Constitutional:  Alert and oriented, No acute distress. HEENT: [place] AT Respiratory: Normal respiratory effort, no increased work of breathing. Psychiatric: Normal mood and affect.     Assessment & Plan:    1. Personal history urinary calculi Asymptomatic 1 year follow-up with KUB    Glendia BROCKS Twylla, MD  [facility] [address] [place], [place] [address] [phone]

## 2023-07-24 ENCOUNTER — Encounter: Payer: Self-pay | Admitting: Urology

## 2023-08-16 ENCOUNTER — Ambulatory Visit
Admission: RE | Admit: 2023-08-16 | Discharge: 2023-08-16 | Disposition: A | Discharge status: Home / Self Care | Source: Ambulatory Visit | Attending: Family Medicine | Admitting: Family Medicine

## 2023-08-16 ENCOUNTER — Other Ambulatory Visit: Payer: Self-pay | Admitting: Family Medicine

## 2023-08-16 DIAGNOSIS — R229 Localized swelling, mass and lump, unspecified: Secondary | ICD-10-CM | POA: Insufficient documentation

## 2023-09-06 ENCOUNTER — Other Ambulatory Visit: Payer: Self-pay | Admitting: Medical Genetics

## 2023-11-18 ENCOUNTER — Other Ambulatory Visit: Payer: Self-pay | Admitting: Medical Genetics

## 2023-11-18 DIAGNOSIS — Z006 Encounter for examination for normal comparison and control in clinical research program: Secondary | ICD-10-CM

## 2024-07-17 ENCOUNTER — Ambulatory Visit: Admitting: Urology
